# Patient Record
Sex: Female | Born: 1985 | Race: Black or African American | Hispanic: No | Marital: Single | Smoking: Never smoker
Health system: Southern US, Community
[De-identification: ages and names within clinical notes are randomized; demographics above are authoritative.]

## PROBLEM LIST (undated history)

## (undated) ENCOUNTER — Inpatient Hospital Stay (HOSPITAL_COMMUNITY): Payer: Self-pay

## (undated) DIAGNOSIS — Z789 Other specified health status: Secondary | ICD-10-CM

## (undated) DIAGNOSIS — I10 Essential (primary) hypertension: Secondary | ICD-10-CM

## (undated) HISTORY — PX: NO PAST SURGERIES: SHX2092

---
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
## 2009-07-16 ENCOUNTER — Other Ambulatory Visit: Admission: RE | Admit: 2009-07-16 | Discharge: 2009-07-16 | Payer: Self-pay | Admitting: Obstetrics and Gynecology

## 2009-07-16 ENCOUNTER — Ambulatory Visit: Payer: Self-pay | Admitting: Obstetrics and Gynecology

## 2009-07-30 ENCOUNTER — Ambulatory Visit: Payer: Self-pay | Admitting: Obstetrics & Gynecology

## 2010-12-25 ENCOUNTER — Emergency Department (HOSPITAL_COMMUNITY): Payer: No Typology Code available for payment source

## 2010-12-25 ENCOUNTER — Emergency Department (HOSPITAL_COMMUNITY)
Admission: EM | Admit: 2010-12-25 | Discharge: 2010-12-25 | Disposition: A | Discharge status: Home / Self Care | Payer: No Typology Code available for payment source | Attending: Emergency Medicine | Admitting: Emergency Medicine

## 2010-12-25 DIAGNOSIS — S93609A Unspecified sprain of unspecified foot, initial encounter: Secondary | ICD-10-CM | POA: Insufficient documentation

## 2010-12-25 DIAGNOSIS — M79609 Pain in unspecified limb: Secondary | ICD-10-CM | POA: Insufficient documentation

## 2010-12-25 DIAGNOSIS — M25519 Pain in unspecified shoulder: Secondary | ICD-10-CM | POA: Insufficient documentation

## 2010-12-25 DIAGNOSIS — W010XXA Fall on same level from slipping, tripping and stumbling without subsequent striking against object, initial encounter: Secondary | ICD-10-CM | POA: Insufficient documentation

## 2010-12-25 IMAGING — CR DG FOOT COMPLETE 3+V*L*
3 series · 3 of 3 positions shown · non-contrast
Comparison: None.

CLINICAL DATA: Status post fall, twisted left foot, swelling on top
of foot pain

LEFT FOOT - COMPLETE 3+ VIEW

[t foot ap left]
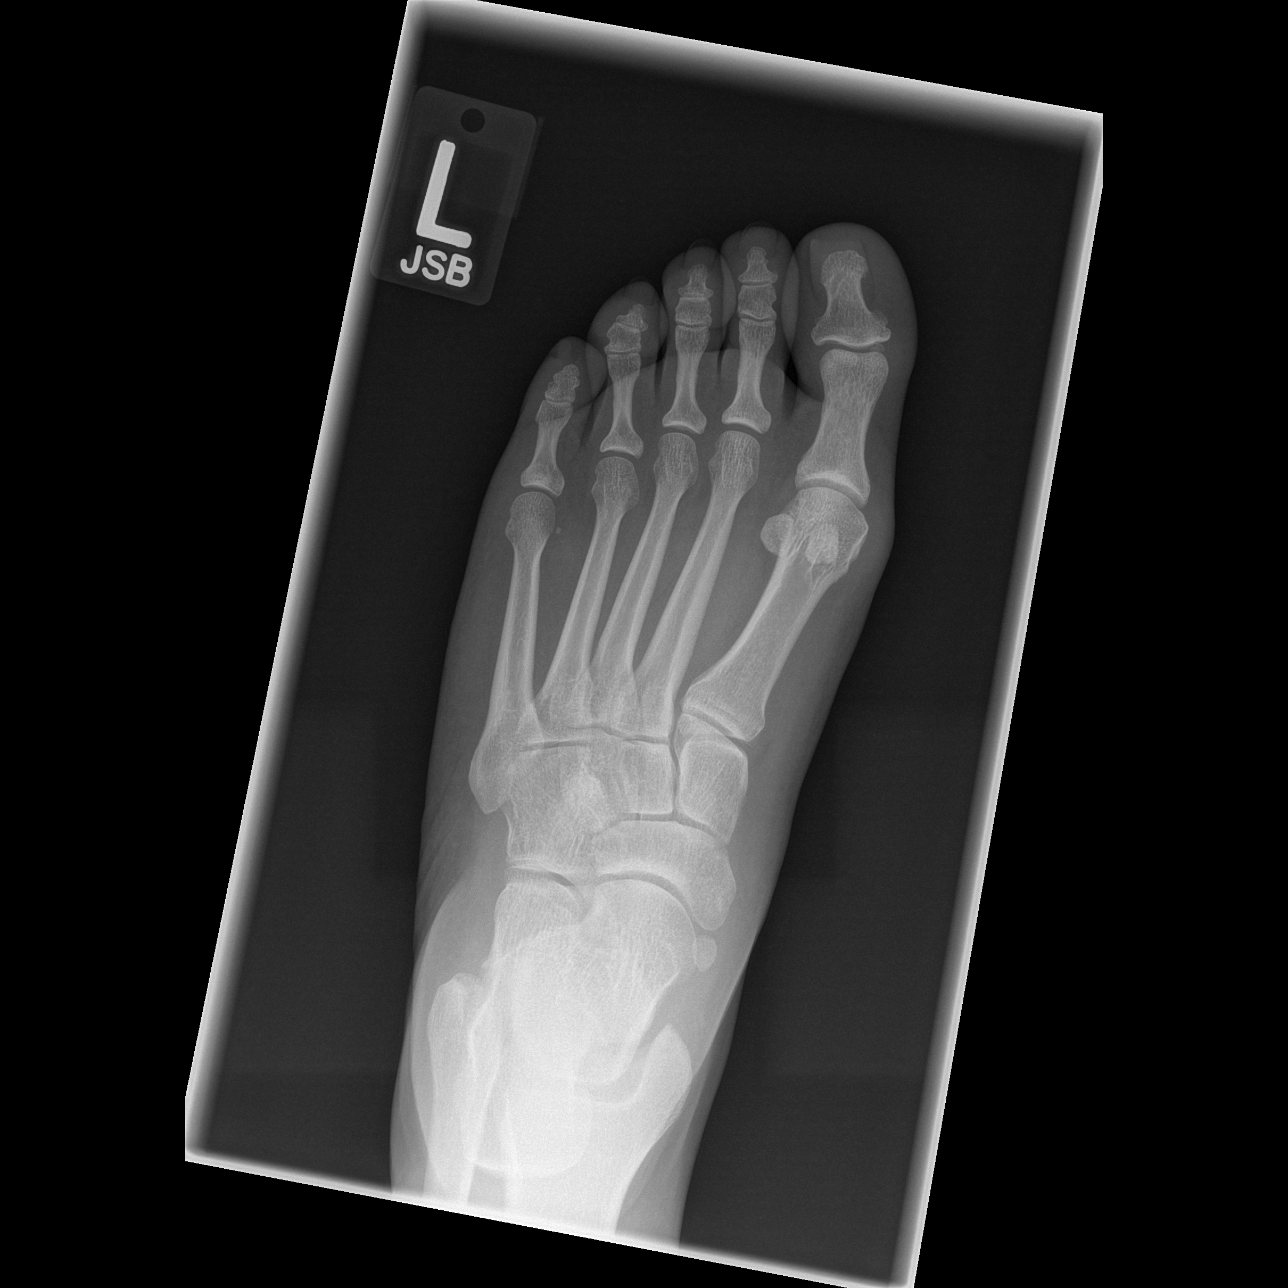

[t foot oblique left]
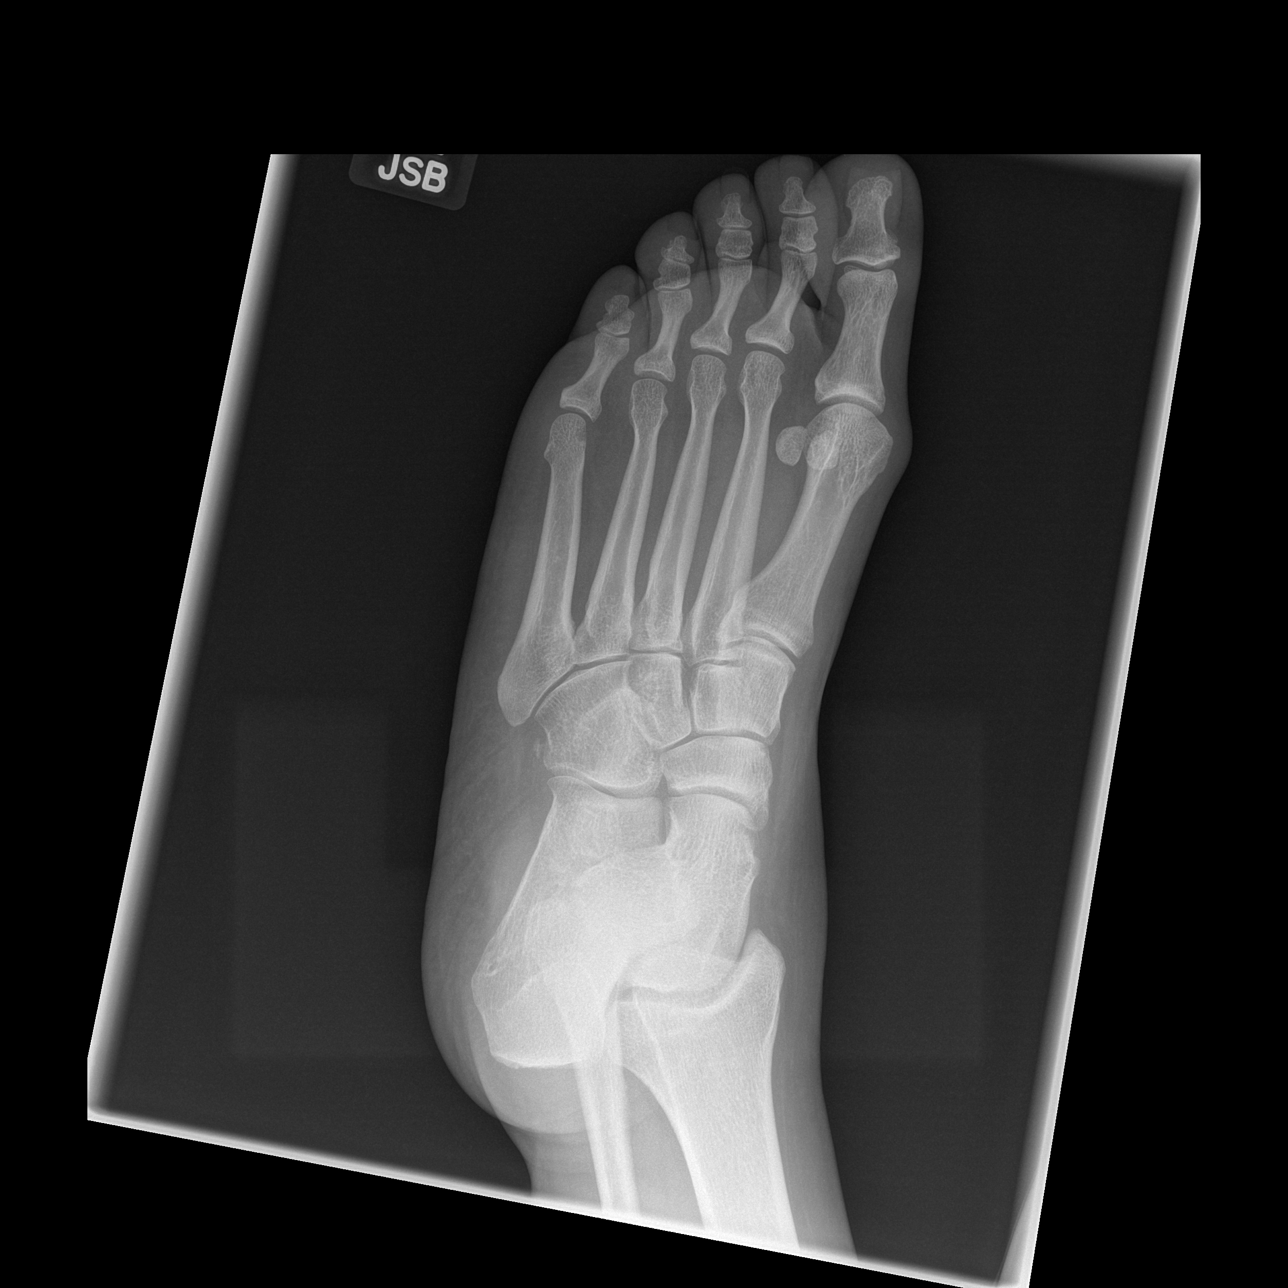

[t foot lat left]
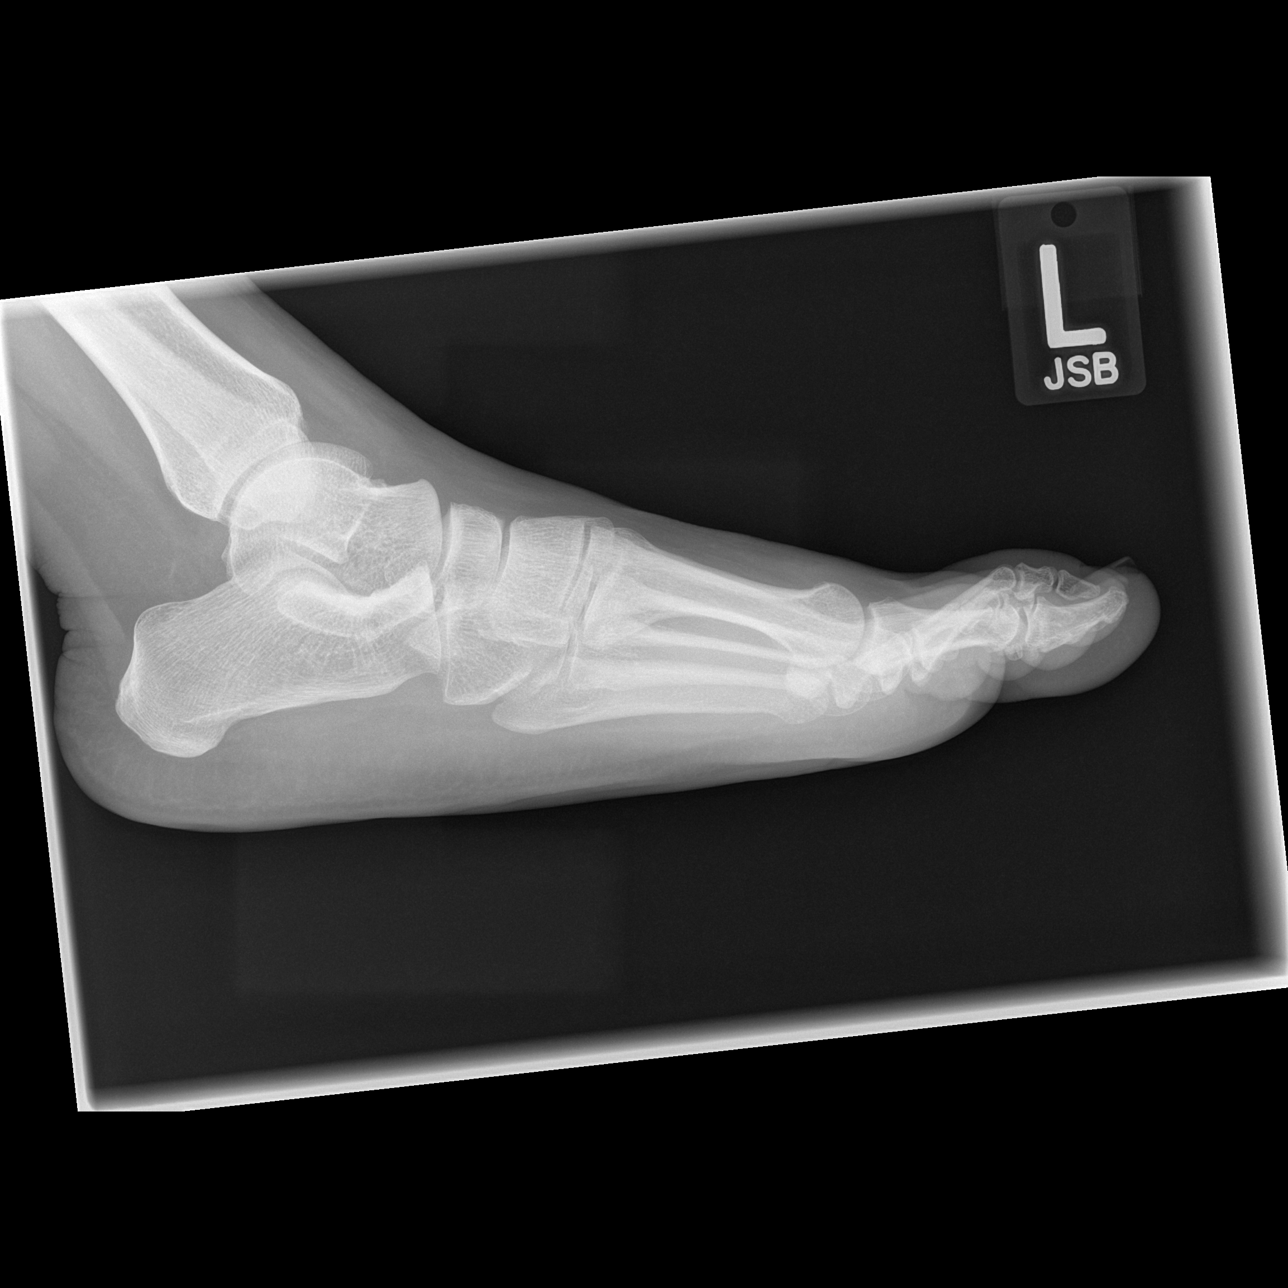

[3 of 3 positions shown; findings below may reference images not displayed]

FINDINGS: No fracture or dislocation is seen.  The base of the
fifth metatarsal is intact.  Well corticated osseous density along
the lateral cuboid, likely reflecting an accessory ossicle.

The joint spaces are preserved.

Visualized soft tissues are grossly unremarkable.
IMPRESSION: No fracture or dislocation is seen.

## 2011-09-07 NOTE — L&D Delivery Note (Signed)
Delivery Note At 3:19 AM a viable and healthy female was delivered via Vaginal, Spontaneous Delivery (Presentation: ; Occiput Anterior).  APGAR: 8, 9; weight 7 lb (3175 g).   Placenta status: Intact, Spontaneous.  Cord: 3 vessels with the following complications: None.  Cord pH: n/a  Anesthesia: Epidural  Episiotomy: None Lacerations: small vaginal tear Suture Repair: n/a, vaginal tear hemostatic Est. Blood Loss (mL): 200  Mom to postpartum.  Baby to nursery-stable.  Camora Tremain 05/26/2012, 5:09 AM

## 2011-09-07 NOTE — L&D Delivery Note (Signed)
Attended delivery. Agree with note. No difficulty with shoulders  Wynelle Bourgeois CNM

## 2011-10-25 LAB — OB RESULTS CONSOLE RPR: RPR: NONREACTIVE

## 2011-10-25 LAB — OB RESULTS CONSOLE HEPATITIS B SURFACE ANTIGEN: Hepatitis B Surface Ag: NEGATIVE

## 2011-10-25 LAB — OB RESULTS CONSOLE ABO/RH: RH Type: POSITIVE

## 2011-10-25 LAB — OB RESULTS CONSOLE ANTIBODY SCREEN: Antibody Screen: NEGATIVE

## 2011-10-25 LAB — OB RESULTS CONSOLE RUBELLA ANTIBODY, IGM: Rubella: IMMUNE

## 2011-11-22 ENCOUNTER — Other Ambulatory Visit: Payer: Self-pay | Admitting: Adult Health

## 2011-11-22 ENCOUNTER — Other Ambulatory Visit (HOSPITAL_COMMUNITY)
Admission: RE | Admit: 2011-11-22 | Discharge: 2011-11-22 | Disposition: A | Discharge status: Home / Self Care | Payer: Medicaid Other | Source: Ambulatory Visit | Attending: Obstetrics and Gynecology | Admitting: Obstetrics and Gynecology

## 2011-11-22 DIAGNOSIS — Z01419 Encounter for gynecological examination (general) (routine) without abnormal findings: Secondary | ICD-10-CM | POA: Insufficient documentation

## 2011-11-22 DIAGNOSIS — Z113 Encounter for screening for infections with a predominantly sexual mode of transmission: Secondary | ICD-10-CM | POA: Insufficient documentation

## 2012-04-25 ENCOUNTER — Inpatient Hospital Stay (HOSPITAL_COMMUNITY)
Admission: AD | Admit: 2012-04-25 | Discharge: 2012-04-25 | Disposition: A | Discharge status: Home / Self Care | Payer: Medicaid Other | Source: Ambulatory Visit | Attending: Obstetrics & Gynecology | Admitting: Obstetrics & Gynecology

## 2012-04-25 ENCOUNTER — Encounter (HOSPITAL_COMMUNITY): Payer: Self-pay | Admitting: Advanced Practice Midwife

## 2012-04-25 DIAGNOSIS — Z331 Pregnant state, incidental: Secondary | ICD-10-CM

## 2012-04-25 DIAGNOSIS — Z3490 Encounter for supervision of normal pregnancy, unspecified, unspecified trimester: Secondary | ICD-10-CM

## 2012-04-25 NOTE — MAU Provider Note (Signed)
  History     CSN: [id number]  Arrival date and time: 04/25/12 1238   None     Chief Complaint  Patient presents with  . Medical Clearance   HPI This is a 26 y.o. 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  Got one from [facility] last semester, but they say they cannot permit her to come to clinical with a lifting restriction (old letter restricted lifting to 25 lb).   She can only miss two days of school before being terminated. Missed today because of this (sent home).  FT office cannot see her today.  OB History    Grav Para Term Preterm Abortions TAB SAB Ect Mult Living   1               No past medical history on file.  No past surgical history on file.  No family history on file.  History  Substance Use Topics  . Smoking status: Not on file  . Smokeless tobacco: Not on file  . Alcohol Use: Not on file    Allergies: Allergies not on file  No prescriptions prior to admission    ROS As listed above. No complaints.  Physical Exam   There were no vitals taken for this visit.  Physical Exam  Constitutional: She is oriented to person, place, and time. She appears well-developed and well-nourished.  Neurological: She is alert and oriented to person, place, and time.  Skin: Skin is warm and dry.  Psychiatric: She has a normal mood and affect.  Exam not indicated.  MAU Course  Procedures  Assessment and Plan  Letter given that she can do clinical with no restrictions.  Wynelle Bourgeois 04/25/2012, 1:31 PM

## 2012-04-25 NOTE — MAU Note (Signed)
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 Patient denies any problems. Reports good fetal movement.

## 2012-04-25 NOTE — MAU Provider Note (Signed)
Attestation of Attending Supervision of Advanced Practitioner (CNM/NP): Evaluation and management procedures were performed by the Advanced Practitioner under my supervision and collaboration.  I have reviewed the Advanced Practitioner's note and chart, and I agree with the management and plan.  Chrystal Zeimet, MD, FACOG Attending Obstetrician & Gynecologist Faculty Practice, [facility], [place]  

## 2012-04-26 ENCOUNTER — Inpatient Hospital Stay (HOSPITAL_COMMUNITY)
Admission: AD | Admit: 2012-04-26 | Discharge: 2012-04-26 | Disposition: A | Discharge status: Home / Self Care | Payer: Medicaid Other | Source: Ambulatory Visit | Attending: Obstetrics & Gynecology | Admitting: Obstetrics & Gynecology

## 2012-04-26 NOTE — MAU Note (Signed)
Patient returned to MAU today stating the note written by the CNM yesterday was not accepted by her instructor. This RN called [facility]. Dr. Emelda Fear will not sign off on her ability to do full clinical duty until he sees her in his office on Monday at 1015. Patient continues to states she is not having any problems.

## 2012-05-15 LAB — OB RESULTS CONSOLE GC/CHLAMYDIA
Chlamydia: NEGATIVE
Gonorrhea: NEGATIVE

## 2012-05-15 LAB — OB RESULTS CONSOLE ABO/RH

## 2012-05-24 ENCOUNTER — Inpatient Hospital Stay (HOSPITAL_COMMUNITY)
Admission: AD | Admit: 2012-05-24 | Discharge: 2012-05-28 | DRG: 775 | Disposition: A | Discharge status: Home / Self Care | Payer: Medicaid Other | Source: Ambulatory Visit | Attending: Obstetrics & Gynecology | Admitting: Obstetrics & Gynecology

## 2012-05-24 ENCOUNTER — Encounter (HOSPITAL_COMMUNITY): Payer: Self-pay | Admitting: *Deleted

## 2012-05-24 ENCOUNTER — Inpatient Hospital Stay (HOSPITAL_COMMUNITY): Payer: Medicaid Other

## 2012-05-24 DIAGNOSIS — O99892 Other specified diseases and conditions complicating childbirth: Secondary | ICD-10-CM | POA: Diagnosis present

## 2012-05-24 DIAGNOSIS — O1490 Unspecified pre-eclampsia, unspecified trimester: Secondary | ICD-10-CM

## 2012-05-24 DIAGNOSIS — O139 Gestational [pregnancy-induced] hypertension without significant proteinuria, unspecified trimester: Principal | ICD-10-CM | POA: Diagnosis present

## 2012-05-24 DIAGNOSIS — Z22330 Carrier of Group B streptococcus: Secondary | ICD-10-CM

## 2012-05-24 HISTORY — DX: Other specified health status: Z78.9

## 2012-05-24 LAB — COMPREHENSIVE METABOLIC PANEL
Alkaline Phosphatase: 103 U/L (ref 39–117)
BUN: 5 mg/dL — ABNORMAL LOW (ref 6–23)
CO2: 21 mEq/L (ref 19–32)
Calcium: 9.1 mg/dL (ref 8.4–10.5)
Chloride: 102 mEq/L (ref 96–112)
Potassium: 4 mEq/L (ref 3.5–5.1)
Total Bilirubin: 0.2 mg/dL — ABNORMAL LOW (ref 0.3–1.2)
Total Protein: 6.8 g/dL (ref 6.0–8.3)

## 2012-05-24 LAB — URINALYSIS, ROUTINE W REFLEX MICROSCOPIC
Bilirubin Urine: NEGATIVE
Glucose, UA: NEGATIVE mg/dL
Ketones, ur: NEGATIVE mg/dL
Leukocytes, UA: NEGATIVE
Nitrite: NEGATIVE
Protein, ur: NEGATIVE mg/dL
pH: 7.5 (ref 5.0–8.0)

## 2012-05-24 LAB — CBC
MCH: 28.4 pg (ref 26.0–34.0)
MCV: 87.1 fL (ref 78.0–100.0)
RDW: 13.7 % (ref 11.5–15.5)

## 2012-05-24 LAB — ABO/RH: ABO/RH(D): O POS

## 2012-05-24 LAB — TYPE AND SCREEN: ABO/RH(D): O POS

## 2012-05-24 MED ORDER — MAGNESIUM SULFATE 40 G IN LACTATED RINGERS - SIMPLE
2.0000 g/h | INTRAVENOUS | Status: DC
  Administered 2012-05-24 – 2012-05-25 (×2): 2 g/h via INTRAVENOUS
  Filled 2012-05-24 (×2): qty 500

## 2012-05-24 MED ORDER — MISOPROSTOL 25 MCG QUARTER TABLET
25.0000 ug | ORAL_TABLET | ORAL | Status: DC | PRN
  Administered 2012-05-24 (×2): 25 ug via VAGINAL
  Filled 2012-05-24 (×2): qty 0.25

## 2012-05-24 MED ORDER — OXYTOCIN BOLUS FROM INFUSION
500.0000 mL | Freq: Once | INTRAVENOUS | Status: DC
  Filled 2012-05-24: qty 500

## 2012-05-24 MED ORDER — TERBUTALINE SULFATE 1 MG/ML IJ SOLN: 0.2500 mg | Freq: Once | INTRAMUSCULAR | Status: AC | PRN

## 2012-05-24 MED ORDER — DEXTROSE 5 % IV SOLN
5.0000 10*6.[IU] | Freq: Once | INTRAVENOUS | Status: DC
  Filled 2012-05-24: qty 5

## 2012-05-24 MED ORDER — CITRIC ACID-SODIUM CITRATE 334-500 MG/5ML PO SOLN: 30.0000 mL | ORAL | Status: DC | PRN

## 2012-05-24 MED ORDER — BUTORPHANOL TARTRATE 1 MG/ML IJ SOLN: 1.0000 mg | INTRAMUSCULAR | Status: DC | PRN

## 2012-05-24 MED ORDER — ONDANSETRON HCL 4 MG/2ML IJ SOLN: 4.0000 mg | Freq: Four times a day (QID) | INTRAMUSCULAR | Status: DC | PRN

## 2012-05-24 MED ORDER — LABETALOL HCL 5 MG/ML IV SOLN
20.0000 mg | INTRAVENOUS | Status: DC | PRN
  Administered 2012-05-25 – 2012-05-26 (×3): 20 mg via INTRAVENOUS
  Filled 2012-05-24 (×4): qty 4

## 2012-05-24 MED ORDER — MAGNESIUM SULFATE BOLUS VIA INFUSION
4.0000 g | Freq: Once | INTRAVENOUS | Status: AC
  Administered 2012-05-24: 4 g via INTRAVENOUS
  Filled 2012-05-24: qty 500

## 2012-05-24 MED ORDER — LACTATED RINGERS IV SOLN
INTRAVENOUS | Status: DC
  Administered 2012-05-24 – 2012-05-25 (×2): via INTRAVENOUS
  Administered 2012-05-25: 100 mL/h via INTRAVENOUS
  Administered 2012-05-26: 05:00:00 via INTRAVENOUS

## 2012-05-24 MED ORDER — IBUPROFEN 600 MG PO TABS: 600.0000 mg | ORAL_TABLET | Freq: Four times a day (QID) | ORAL | Status: DC | PRN

## 2012-05-24 MED ORDER — LACTATED RINGERS IV SOLN: 500.0000 mL | INTRAVENOUS | Status: DC | PRN

## 2012-05-24 MED ORDER — ACETAMINOPHEN 325 MG PO TABS
650.0000 mg | ORAL_TABLET | ORAL | Status: DC | PRN
  Administered 2012-05-25 (×2): 650 mg via ORAL
  Filled 2012-05-24 (×2): qty 2

## 2012-05-24 MED ORDER — LIDOCAINE HCL (PF) 1 % IJ SOLN
30.0000 mL | INTRAMUSCULAR | Status: DC | PRN
  Filled 2012-05-24: qty 30

## 2012-05-24 MED ORDER — OXYTOCIN 40 UNITS IN LACTATED RINGERS INFUSION - SIMPLE MED
62.5000 mL/h | Freq: Once | INTRAVENOUS | Status: AC
  Administered 2012-05-26: 500 mL/h via INTRAVENOUS

## 2012-05-24 MED ORDER — PENICILLIN G POTASSIUM 5000000 UNITS IJ SOLR
2.5000 10*6.[IU] | INTRAVENOUS | Status: DC
  Filled 2012-05-24: qty 2.5

## 2012-05-24 MED ORDER — FLEET ENEMA 7-19 GM/118ML RE ENEM: 1.0000 | ENEMA | RECTAL | Status: DC | PRN

## 2012-05-24 MED ORDER — OXYCODONE-ACETAMINOPHEN 5-325 MG PO TABS: 1.0000 | ORAL_TABLET | ORAL | Status: DC | PRN

## 2012-05-24 NOTE — Progress Notes (Signed)
   Cristina Murphy is a 26 y.o. G1P0 at [redacted]w[redacted]d  admitted for induction of labor due to Hypertension.  Subjective: Denies HA, blurred vision, RUQ pain.  Feels "crampy"  Objective: BP 148/70  Pulse 91  Temp 98.3 F (36.8 C) (Oral)  Resp 20  Ht 5\' 4"  (1.626 m)  Wt 131.09 kg (289 lb)  BMI 49.61 kg/m2  LMP 08/30/2011 Total I/O In: 920 [P.O.:420; I.V.:500] Out: 575 [Urine:575]  FHT:  FHR: 140 bpm, variability: moderate,  accelerations:  Present,  decelerations:  Absent UC:   none, irregular, every 2-6 minutes SVE:   Dilation: 1 Effacement (%): Thick Station: -3 Exam by:: Everrett Coombe RN  Labs: Lab Results  Component Value Date   WBC 14.3* 05/24/2012   HGB 11.2* 05/24/2012   HCT 34.3* 05/24/2012   MCV 87.1 05/24/2012   PLT 214 05/24/2012    Assessment / Plan: IOL, ripening phase  Labor: no Fetal Wellbeing:  Category I Pain Control:  Labor support without medications Anticipated MOD:  NSVD  Murphy,Cristina Haslem 05/24/2012, 11:07 PM

## 2012-05-24 NOTE — Progress Notes (Signed)
Patient asking if she could have the option of trying BP medication and going home so she could make it to class tomorrow. States she has a test and cannot miss anymore or she will be failed in the class. Advised patient to discuss with Dr. Despina Hidden as he is her primary MD at [facility] and is on call tonight.

## 2012-05-24 NOTE — Progress Notes (Signed)
1745 Pt arrived to unit requesting to speak with attending physician about plan of care.  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  Dr Despina Hidden will come to bedside.

## 2012-05-24 NOTE — Progress Notes (Signed)
Cristina Murphy is a 26 y.o. G1P0 at 110w2d by LMP admitted for induction of labor due to Hypertension.  Subjective: Pt feeling well, sitting in bed eating dinner. NO complaints at this time.  Appears to be more content now, understands that it is important that she be induced.  Wants to make sure the baby isn't stressed from the induction.  Objective: BP 148/78  Pulse 94  Temp 97.8 F (36.6 C) (Oral)  Resp 18  Ht 5\' 4"  (1.626 m)  Wt 131.09 kg (289 lb)  BMI 49.61 kg/m2  LMP 08/30/2011 I/O last 3 completed shifts: In: 152.1 [I.V.:152.1] Out: 0     FHT:  FHR: 130 bpm, variability: moderate,  accelerations:  Present,  decelerations:  Absent UC:   irregular, every 2-6 minutes SVE:   Dilation: Closed Effacement (%): Thick Station: -3 Exam by:: Currie Paris, RN  Labs: Lab Results  Component Value Date   WBC 14.3* 05/24/2012   HGB 11.2* 05/24/2012   HCT 34.3* 05/24/2012   MCV 87.1 05/24/2012   PLT 214 05/24/2012    Assessment / Plan: IOL for severe range BPs, GHTN, s/p cytotec x1  Labor: s/p cytotec x1 Preeclampsia:  on magnesium sulfate, no signs or symptoms of toxicity and intake and ouput balanced Fetal Wellbeing:  Category I Pain Control:  Labor support without medications and epidural once in more active labor if pt desires I/D:  n/a; will start PCN around noon tomorrow for GBS ppx, once in more active labor Anticipated MOD:  NSVD  Johnwilliam Shepperson 05/24/2012, 8:10 PM

## 2012-05-24 NOTE — MAU Provider Note (Signed)
History     CSN: [id number]  Arrival date and time: 05/24/12 1434   None     Chief Complaint  Patient presents with  . 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  Pt had PNV 2 days ago at [facility] and systolic BP was 150's- labs were drawn and urine was sent at that visit.  Pt was instructed to f/u tomorrow. Patient reports no symptoms until this morning when driving to work.  She suddenly felt light-headed and had some hazy vision. She thought it was from not eating, but experienced this again after she ate.  She denies headache, spots in her vision, RUQ pain, nausea.  No h/o HTN outside of pregnancy. No problems with BP until this most recent visit.     No ctx, no LOF, no vaginal bleeding. +FM  Hypertension Associated symptoms include blurred vision. Pertinent negatives include no chest pain, headaches or shortness of breath.    OB History    Grav Para Term Preterm Abortions TAB SAB Ect Mult Living   1         0      Past Medical History  Diagnosis Date  . No pertinent past medical history     Past Surgical History  Procedure Date  . No past surgeries     History reviewed. No pertinent family history.  History  Substance Use Topics  . Smoking status: Never Smoker   . Smokeless tobacco: Never Used  . Alcohol Use: No    Allergies: No Known Allergies  Prescriptions prior to admission  Medication Sig Dispense Refill  . omeprazole (PRILOSEC) 20 MG capsule Take 20 mg by mouth as needed.      . Prenatal Vit-Fe Fumarate-FA (MULTIVITAMIN-PRENATAL) 27-0.8 MG TABS Take 1 tablet by mouth daily.        Review of Systems  Constitutional: Negative for fever.  Eyes: Positive for blurred vision.  Respiratory: Negative for cough and shortness of breath.   Cardiovascular: Negative for chest pain.    Gastrointestinal: Negative for nausea and abdominal pain.  Genitourinary: Negative for dysuria.  Musculoskeletal: Negative for falls.  Neurological: Positive for dizziness. Negative for focal weakness, seizures and headaches.   Physical Exam   Blood pressure 161/98, pulse 81, temperature 98.6 F (37 C), temperature source Oral, resp. rate 18, height 5\' 4"  (1.626 m), weight 131.09 kg (289 lb), last menstrual period 08/30/2011.  Physical Exam  Constitutional: She appears well-developed and well-nourished. No distress.  HENT:  Head: Normocephalic and atraumatic.  Cardiovascular: Normal rate, regular rhythm, normal heart sounds and intact distal pulses.   No murmur heard. Respiratory: Effort normal and breath sounds normal. No respiratory distress. She has no wheezes.  GI:       gravid  Musculoskeletal: She exhibits edema.       1+ BLE edema   Neurological: She displays normal reflexes.  Skin: Skin is warm and dry.   FHT: 120's, moderate variability, + accels Toco: no ctx  Dilation: Closed Effacement (%): Thick Cervical Position: Posterior Station: -3 Presentation: Vertex Exam by:: Dr. Fara Boros   MAU Course  Procedures  MDM -Will check PIH labs to r/o pre-eclampsia vs gestational HTN (UA and CBC normal; CMET and Pr:Cr pending) -Will check cervix as pt likely needs to be admitted to be induced regardless  of if this is GHTN vs preX 2/2 severe range pressures -Called [facility], who will fax most recent U/S and records -Will check AFI if no U/S in the last 1 week -Treat pressures with labetolol 20mg  IV PRN  All labs (plt, LFTs, Pr:Cr ratio normal) Pr:Cr 0.11  Assessment and Plan  26 yo G1P0 @ 38.2 (LMP and early U/S) p/w GHTN with severe range pressures -Admit to L&D for IOL for severe range pressures and GHTN -Mag 2/2 severe range pressures -Labetalol PRN BP >160/110 -Cytotec for induction for unfavorable cervix -Will let pt eat dinner -GBS positive: will start on  PCN in AM for ppx  MCGILL,JACQUELYN 05/24/2012, 3:32 PM  Evaluation and management procedures were performed by Resident physician under my supervision/collaboration. Chart reviewed, patient examined by me and I agree with management and plan. (except did not agree with [place] which was done before I was consulted)

## 2012-05-24 NOTE — MAU Note (Signed)
States was feeling lightheaded at work. Had someone check BP and it was elevated.

## 2012-05-24 NOTE — H&P (Signed)
Cristina Murphy is a 26 y.o. female presenting for lightheadedness and hazy vision.  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 Pt was instructed to f/u tomorrow. Patient reports no symptoms until this morning when driving to work. She suddenly felt light-headed and had some hazy vision. She thought it was from not eating, but experienced this again after she ate. She denies headache, spots in her vision, RUQ pain, nausea. No h/o HTN outside of pregnancy. No problems with BP until this most recent visit.  No ctx, no LOF, no vaginal bleeding. +FM . History OB History    Grav Para Term Preterm Abortions TAB SAB Ect Mult Living   1         0     Past Medical History  Diagnosis Date  . No pertinent past medical history    Past Surgical History  Procedure Date  . No past surgeries    Family History: family history is not on file. Social History:  reports that she has never smoked. She has never used smokeless tobacco. She reports that she does not drink alcohol or use illicit drugs.   Prenatal Transfer Tool  Maternal Diabetes: No Genetic Screening: Normal Maternal Ultrasounds/Referrals: Normal Fetal Ultrasounds or other Referrals:  None Maternal Substance Abuse:  No Significant Maternal Medications:  None Significant Maternal Lab Results:  Lab values include: Group B Strep positive Other Comments:  IOL at 38.2 for GHTN, severe range pressures  ROS  Dilation: Closed Effacement (%): Thick Station: -3 Exam by:: Dr. Fara Boros Blood pressure 178/101, pulse 90, temperature 98.6 F (37 C), temperature source Oral, resp. rate 18, height 5\' 4"  (1.626 m), weight 131.09 kg (289 lb), last menstrual period 08/30/2011. Exam Physical Exam  Constitutional:  She appears well-developed and well-nourished.  HENT:  Head: Normocephalic and atraumatic.  Eyes: Pupils are equal, round, and reactive to light.  Cardiovascular: Normal rate, regular rhythm, normal heart sounds and intact distal pulses.   No murmur heard. Respiratory: Effort normal and breath sounds normal. No respiratory distress. She has no wheezes.  GI:       Gravid, obese   Musculoskeletal: She exhibits edema.       1+ BLE pitting edema  Neurological: She is alert. She displays normal reflexes.  Skin: Skin is warm and dry.    FHT: 120's, moderate variability, + accels Toco: no ctx  Dilation: Closed Effacement (%): Thick Cervical Position: Posterior Station: -3 Presentation: Vertex Exam by:: Dr. Fara Boros   Prenatal labs: ABO, Rh: O/--/-- (09/09 0000) Antibody: Negative (02/18 0000) Rubella: Immune (02/18 0000) RPR: Nonreactive (02/18 0000)  HBsAg: Negative (02/18 0000)  HIV: Non-reactive (02/18 0000)  GBS: Positive (09/09 0000)   Assessment/Plan: 26 yo G1P0 @ 38.2 (LMP and early U/S) p/w GHTN with severe range pressures  -Admit to L&D for IOL for severe range pressures and GHTN  -Mag 2/2 severe range pressures  -Labetalol PRN BP >160/110  -Cytotec for induction for unfavorable cervix  -Will let pt eat dinner  -GBS positive: will start on PCN in AM for ppx -Category 1 fetal heart tracing    Cristina Murphy 05/24/2012, 5:26 PM

## 2012-05-25 ENCOUNTER — Encounter (HOSPITAL_COMMUNITY): Payer: Self-pay | Admitting: *Deleted

## 2012-05-25 MED ORDER — EPHEDRINE 5 MG/ML INJ: 10.0000 mg | INTRAVENOUS | Status: DC | PRN

## 2012-05-25 MED ORDER — PHENYLEPHRINE 40 MCG/ML (10ML) SYRINGE FOR IV PUSH (FOR BLOOD PRESSURE SUPPORT)
80.0000 ug | PREFILLED_SYRINGE | INTRAVENOUS | Status: DC | PRN
  Filled 2012-05-25: qty 5

## 2012-05-25 MED ORDER — LACTATED RINGERS IV SOLN
500.0000 mL | Freq: Once | INTRAVENOUS | Status: AC
  Administered 2012-05-26: 500 mL via INTRAVENOUS

## 2012-05-25 MED ORDER — ZOLPIDEM TARTRATE 5 MG PO TABS: 5.0000 mg | ORAL_TABLET | Freq: Every evening | ORAL | Status: DC | PRN

## 2012-05-25 MED ORDER — EPHEDRINE 5 MG/ML INJ
10.0000 mg | INTRAVENOUS | Status: DC | PRN
  Filled 2012-05-25: qty 4

## 2012-05-25 MED ORDER — OXYTOCIN 40 UNITS IN LACTATED RINGERS INFUSION - SIMPLE MED
1.0000 m[IU]/min | INTRAVENOUS | Status: DC
  Administered 2012-05-25: 2 m[IU]/min via INTRAVENOUS
  Filled 2012-05-25: qty 1000

## 2012-05-25 MED ORDER — PHENYLEPHRINE 40 MCG/ML (10ML) SYRINGE FOR IV PUSH (FOR BLOOD PRESSURE SUPPORT): 80.0000 ug | PREFILLED_SYRINGE | INTRAVENOUS | Status: DC | PRN

## 2012-05-25 MED ORDER — FENTANYL 2.5 MCG/ML BUPIVACAINE 1/10 % EPIDURAL INFUSION (WH - ANES)
14.0000 mL/h | INTRAMUSCULAR | Status: DC
  Administered 2012-05-26: 14 mL/h via EPIDURAL
  Filled 2012-05-25: qty 60

## 2012-05-25 MED ORDER — DIPHENHYDRAMINE HCL 50 MG/ML IJ SOLN: 12.5000 mg | INTRAMUSCULAR | Status: DC | PRN

## 2012-05-25 MED ORDER — LABETALOL HCL 5 MG/ML IV SOLN
20.0000 mg | Freq: Once | INTRAVENOUS | Status: AC
  Administered 2012-05-25: 20 mg via INTRAVENOUS

## 2012-05-25 MED ORDER — FENTANYL CITRATE 0.05 MG/ML IJ SOLN
50.0000 ug | INTRAMUSCULAR | Status: DC | PRN
  Administered 2012-05-25 (×3): 50 ug via INTRAVENOUS
  Filled 2012-05-25 (×3): qty 2

## 2012-05-25 MED ORDER — PENICILLIN G POTASSIUM 5000000 UNITS IJ SOLR
2.5000 10*6.[IU] | INTRAVENOUS | Status: DC
  Administered 2012-05-25 – 2012-05-26 (×5): 2.5 10*6.[IU] via INTRAVENOUS
  Filled 2012-05-25 (×9): qty 2.5

## 2012-05-25 MED ORDER — TERBUTALINE SULFATE 1 MG/ML IJ SOLN: 0.2500 mg | Freq: Once | INTRAMUSCULAR | Status: AC | PRN

## 2012-05-25 MED ORDER — PENICILLIN G POTASSIUM 5000000 UNITS IJ SOLR
5.0000 10*6.[IU] | Freq: Once | INTRAVENOUS | Status: AC
  Administered 2012-05-25: 5 10*6.[IU] via INTRAVENOUS
  Filled 2012-05-25: qty 5

## 2012-05-25 NOTE — Progress Notes (Signed)
Cristina Murphy is a 26 y.o. G1P0 at [redacted]w[redacted]d admitted for induction of labor due to severe range blood pressures.  Subjective:  Pt doing well. Contracting intermittently but not feeling very many of them. Foley bulb still in place.  Denies HA, vision changes, abdominal pain or other concerns.    Objective: BP 159/92  Pulse 84  Temp 97.9 F (36.6 C) (Oral)  Resp 18  Ht 5\' 4"  (1.626 m)  Wt 131.09 kg (289 lb)  BMI 49.61 kg/m2  LMP 08/30/2011 I/O last 3 completed shifts: In: 2742.1 [P.O.:840; I.V.:1652.1; IV Piggyback:250] Out: 1850 [Urine:1850] Total I/O In: 196.3 [P.O.:90; I.V.:106.3] Out: -   FHT:  FHR: 130 bpm, variability: moderate,  accelerations:  Present,  decelerations:  Absent. Last 25 minutes seems to be in a sleep cycle but prior to this, great reactivity UC:   irregular, every 4-12 minutes SVE:   Dilation: 1.5 Effacement (%): 50 Station: -3 Exam by:: Claiborne Rigg CNM  Labs: Lab Results  Component Value Date   WBC 14.3* 05/24/2012   HGB 11.2* 05/24/2012   HCT 34.3* 05/24/2012   MCV 87.1 05/24/2012   PLT 214 05/24/2012    Assessment / Plan: Induction of labor due to severe range blood pressures,  progressing well on pitocin  Labor: induction. Foley bulb in place. will start pitocin @10am  Preeclampsia:  on magnesium sulfate, no signs or symptoms of toxicity, intake and ouput balanced and labs stable Fetal Wellbeing:  Category I and with periods of minimal variability leading to category II tracing- now category I Pain Control:  Labor support without medications I/D:  n/a Anticipated MOD:  NSVD  Rulon Abide 05/25/2012, 9:07 AM

## 2012-05-25 NOTE — Progress Notes (Signed)
Dr. Reola Calkins notified of pt's elevated bp and orders to recheck bp at 2000

## 2012-05-25 NOTE — Progress Notes (Signed)
   Cristina Murphy is a 26 y.o. G1P0 at [redacted]w[redacted]d  admitted for induction of labor due to Hypertension.  Subjective: Feeling crampy   Objective: BP 128/84  Pulse 101  Temp 97.2 F (36.2 C) (Oral)  Resp 18  Ht 5\' 4"  (1.626 m)  Wt 131.09 kg (289 lb)  BMI 49.61 kg/m2  LMP 08/30/2011 Total I/O In: 1595 [P.O.:720; I.V.:875] Out: 850 [Urine:850]  FHT:  FHR: 140 bpm, variability: moderate,  accelerations:  Present,  decelerations:  Absent;  Had a few mild variables and several late decels earlier which responded well to position changes UC:   irregular, every 2-5  minutes SVE:   1-2/50/-3/soft Foley inserted into cervix and inflated with 60cc H20 Labs: Lab Results  Component Value Date   WBC 14.3* 05/24/2012   HGB 11.2* 05/24/2012   HCT 34.3* 05/24/2012   MCV 87.1 05/24/2012   PLT 214 05/24/2012    Assessment / Plan: IOL for severe GHTN, ripening phase  Labor: no Fetal Wellbeing:  Category I Pain Control:  Labor support without medications Anticipated MOD:  NSVD  [name],Modine Oppenheimer 05/25/2012, 3:24 AM

## 2012-05-25 NOTE — Progress Notes (Signed)
Cristina Murphy is a 26 y.o. G1P0 at [redacted]w[redacted]d  admitted for induction of labor due to Hypertension.  Subjective: Pt has a little bit of a frontal HA.  No vision changes, abdominal pain.  +CTX every 3-4 minutes.    Objective: BP 152/78  Pulse 83  Temp 97.9 F (36.6 C) (Oral)  Resp 20  Ht 5\' 4"  (1.626 m)  Wt 131.09 kg (289 lb)  BMI 49.61 kg/m2  LMP 08/30/2011 I/O last 3 completed shifts: In: 2742.1 [P.O.:840; I.V.:1652.1; IV Piggyback:250] Out: 1850 [Urine:1850] Total I/O In: 1539.1 [P.O.:570; I.V.:769.1; IV Piggyback:200] Out: 1450 [Urine:1450]  FHT:  FHR: 130 bpm, variability: moderate,  accelerations:  Present,  decelerations:  Absent UC:   regular, every 3-4 minutes for the last 1 hour SVE:   Dilation: 2.5 Effacement (%): 60 Station: -3 Exam by:: dr. Reola Calkins  Labs: Lab Results  Component Value Date   WBC 14.3* 05/24/2012   HGB 11.2* 05/24/2012   HCT 34.3* 05/24/2012   MCV 87.1 05/24/2012   PLT 214 05/24/2012    Assessment / Plan: Induction of labor due to gestational hypertension,  progressing well on pitocin  Labor: Progressing normally Preeclampsia:  on magnesium sulfate, no signs or symptoms of toxicity, intake and ouput balanced and labs stable Fetal Wellbeing:  Category I Pain Control:  Labor support without medications I/D:  n/a Anticipated MOD:  NSVD  Rulon Abide 05/25/2012, 2:29 PM

## 2012-05-25 NOTE — Progress Notes (Signed)
Cristina Murphy is a 26 y.o. G1P0 at 102w3d by ultrasound admitted for induction of labor due to Hypertension.  Subjective:   Objective: BP 142/79  Pulse 83  Temp 98.9 F (37.2 C) (Oral)  Resp 18  Ht 5\' 4"  (1.626 m)  Wt 289 lb (131.09 kg)  BMI 49.61 kg/m2  LMP 08/30/2011 I/O last 3 completed shifts: In: 5755.8 [P.O.:2010; I.V.:3195.8; IV Piggyback:550] Out: 4700 [Urine:4700] Total I/O In: 350 [I.V.:250; IV Piggyback:100] Out: 475 [Urine:475]  Filed Vitals:   05/25/12 1932 05/25/12 2000 05/25/12 2032 05/25/12 2118  BP: 166/102 167/91 135/67 142/79  Pulse: 82 87 91 83  Temp:  98.9 F (37.2 C)    TempSrc:  Oral    Resp:  18    Height:      Weight:        FHT:  FHR: 140 bpm, variability: moderate,  accelerations:  Abscent,  decelerations:  Absent UC:   irregular, every 3 minutes SVE:  ? Dilation/ 50% / Floating.  Cervix very soft and there is bloody show, but cervix is unreachable.  No presenting part in pelvis.  Bedside US done which shows head in R oblique, near pelvis but not engaged  Labs: Lab Results  Component Value Date   WBC 14.3* 05/24/2012   HGB 11.2* 05/24/2012   HCT 34.3* 05/24/2012   MCV 87.1 05/24/2012   PLT 214 05/24/2012    Assessment / Plan: Induction of labor for Preeclampsia  Labor: progressing slowly with nonengaged head Preeclampsia:  on magnesium sulfate, no signs or symptoms of toxicity, intake and ouput balanced and labs stable Fetal Wellbeing:  Category II Pain Control:  Labor support without medications I/D:  n/a Anticipated MOD:  NSVD but fetal head nonengagement is concerning  [facility] 05/25/2012, 9:44 PM

## 2012-05-25 NOTE — Progress Notes (Signed)
Cristina Murphy is a 26 y.o. G1P0 at [redacted]w[redacted]d admitted for induction of labor due to Hypertension.  Subjective:  Pt feeling contractions but they are mild. Just woke up from a nap. They seem to have spaced out more recently.  She does have a headache related to the magnesium.   Objective: BP 168/87  Pulse 88  Temp 98 F (36.7 C) (Axillary)  Resp 18  Ht 5\' 4"  (1.626 m)  Wt 131.09 kg (289 lb)  BMI 49.61 kg/m2  LMP 08/30/2011 I/O last 3 completed shifts: In: 2742.1 [P.O.:840; I.V.:1652.1; IV Piggyback:250] Out: 1850 [Urine:1850] Total I/O In: 1539.1 [P.O.:570; I.V.:769.1; IV Piggyback:200] Out: 1450 [Urine:1450]  FHT:  FHR: 130 bpm, variability: moderate,  accelerations:  Present,  decelerations:  Absent UC:   irregular, every 2-5 minutes SVE:   Dilation: 2.5 Effacement (%): 60 Station: -3 Exam by:: dr. Reola Calkins  Labs: Lab Results  Component Value Date   WBC 14.3* 05/24/2012   HGB 11.2* 05/24/2012   HCT 34.3* 05/24/2012   MCV 87.1 05/24/2012   PLT 214 05/24/2012    Assessment / Plan: Induction of labor due to gestational hypertension,  progressing well on pitocin  Labor: currently not in a good contraction pattern. In latent labor. PT requested waiting to recheck cervix. Increased pit to 52mu/min Preeclampsia:  on magnesium sulfate, no signs or symptoms of toxicity, intake and ouput balanced and labs stable Fetal Wellbeing:  Category I Pain Control:  Labor support without medications I/D:  n/a Anticipated MOD:  NSVD  Rulon Abide 05/25/2012, 5:53 PM

## 2012-05-26 ENCOUNTER — Encounter (HOSPITAL_COMMUNITY): Payer: Self-pay | Admitting: Anesthesiology

## 2012-05-26 ENCOUNTER — Inpatient Hospital Stay (HOSPITAL_COMMUNITY): Payer: Medicaid Other | Admitting: Anesthesiology

## 2012-05-26 ENCOUNTER — Encounter (HOSPITAL_COMMUNITY): Payer: Self-pay | Admitting: *Deleted

## 2012-05-26 DIAGNOSIS — O9989 Other specified diseases and conditions complicating pregnancy, childbirth and the puerperium: Secondary | ICD-10-CM

## 2012-05-26 DIAGNOSIS — O1490 Unspecified pre-eclampsia, unspecified trimester: Secondary | ICD-10-CM | POA: Diagnosis present

## 2012-05-26 DIAGNOSIS — O139 Gestational [pregnancy-induced] hypertension without significant proteinuria, unspecified trimester: Secondary | ICD-10-CM

## 2012-05-26 DIAGNOSIS — Z22330 Carrier of Group B streptococcus: Secondary | ICD-10-CM

## 2012-05-26 LAB — COMPREHENSIVE METABOLIC PANEL
ALT: 15 U/L (ref 0–35)
AST: 28 U/L (ref 0–37)
Albumin: 2.4 g/dL — ABNORMAL LOW (ref 3.5–5.2)
Calcium: 8.7 mg/dL (ref 8.4–10.5)
Creatinine, Ser: 0.68 mg/dL (ref 0.50–1.10)
GFR calc non Af Amer: >90 mL/min (ref >90)
Sodium: 133 mEq/L — ABNORMAL LOW (ref 135–145)
Total Protein: 6.1 g/dL (ref 6.0–8.3)

## 2012-05-26 LAB — CBC
HCT: 35.1 % — ABNORMAL LOW (ref 36.0–46.0)
HCT: 36.7 % (ref 36.0–46.0)
Hemoglobin: 11.8 g/dL — ABNORMAL LOW (ref 12.0–15.0)
MCH: 28.2 pg (ref 26.0–34.0)
MCHC: 32.5 g/dL (ref 30.0–36.0)
MCV: 87.2 fL (ref 78.0–100.0)
RDW: 13.8 % (ref 11.5–15.5)
WBC: 19.8 10*3/uL — ABNORMAL HIGH (ref 4.0–10.5)

## 2012-05-26 MED ORDER — TETANUS-DIPHTH-ACELL PERTUSSIS 5-2.5-18.5 LF-MCG/0.5 IM SUSP
0.5000 mL | Freq: Once | INTRAMUSCULAR | Status: DC
  Filled 2012-05-26: qty 0.5

## 2012-05-26 MED ORDER — DIBUCAINE 1 % RE OINT: 1.0000 "application " | TOPICAL_OINTMENT | RECTAL | Status: DC | PRN

## 2012-05-26 MED ORDER — DIPHENHYDRAMINE HCL 25 MG PO CAPS: 25.0000 mg | ORAL_CAPSULE | Freq: Four times a day (QID) | ORAL | Status: DC | PRN

## 2012-05-26 MED ORDER — INFLUENZA VIRUS VACC SPLIT PF IM SUSP
0.5000 mL | INTRAMUSCULAR | Status: AC
Start: 2012-05-27 — End: 2012-05-27
  Administered 2012-05-27: 0.5 mL via INTRAMUSCULAR
  Filled 2012-05-26: qty 0.5

## 2012-05-26 MED ORDER — OXYCODONE-ACETAMINOPHEN 5-325 MG PO TABS
1.0000 | ORAL_TABLET | ORAL | Status: DC | PRN
  Administered 2012-05-27: 1 via ORAL
  Filled 2012-05-26: qty 1

## 2012-05-26 MED ORDER — IBUPROFEN 600 MG PO TABS
600.0000 mg | ORAL_TABLET | Freq: Four times a day (QID) | ORAL | Status: DC
  Administered 2012-05-26 – 2012-05-28 (×10): 600 mg via ORAL
  Filled 2012-05-26 (×10): qty 1

## 2012-05-26 MED ORDER — PRENATAL MULTIVITAMIN CH
1.0000 | ORAL_TABLET | Freq: Every day | ORAL | Status: DC
  Administered 2012-05-26 – 2012-05-28 (×3): 1 via ORAL
  Filled 2012-05-26 (×3): qty 1

## 2012-05-26 MED ORDER — LIDOCAINE HCL (PF) 1 % IJ SOLN
INTRAMUSCULAR | Status: DC | PRN
  Administered 2012-05-26 (×2): 5 mL

## 2012-05-26 MED ORDER — ZOLPIDEM TARTRATE 5 MG PO TABS: 5.0000 mg | ORAL_TABLET | Freq: Every evening | ORAL | Status: DC | PRN

## 2012-05-26 MED ORDER — MAGNESIUM SULFATE 40 G IN LACTATED RINGERS - SIMPLE
2.0000 g/h | INTRAVENOUS | Status: DC
  Administered 2012-05-26: 2 g/h via INTRAVENOUS
  Filled 2012-05-26 (×2): qty 500

## 2012-05-26 MED ORDER — SIMETHICONE 80 MG PO CHEW: 80.0000 mg | CHEWABLE_TABLET | ORAL | Status: DC | PRN

## 2012-05-26 MED ORDER — LACTATED RINGERS IV SOLN
INTRAVENOUS | Status: DC
  Administered 2012-05-26 – 2012-05-27 (×2): via INTRAVENOUS

## 2012-05-26 MED ORDER — ONDANSETRON HCL 4 MG/2ML IJ SOLN: 4.0000 mg | INTRAMUSCULAR | Status: DC | PRN

## 2012-05-26 MED ORDER — BENZOCAINE-MENTHOL 20-0.5 % EX AERO
1.0000 "application " | INHALATION_SPRAY | CUTANEOUS | Status: DC | PRN
  Filled 2012-05-26: qty 56

## 2012-05-26 MED ORDER — SENNOSIDES-DOCUSATE SODIUM 8.6-50 MG PO TABS
2.0000 | ORAL_TABLET | Freq: Every day | ORAL | Status: DC
  Administered 2012-05-26 – 2012-05-27 (×2): 2 via ORAL

## 2012-05-26 MED ORDER — WITCH HAZEL-GLYCERIN EX PADS: 1.0000 "application " | MEDICATED_PAD | CUTANEOUS | Status: DC | PRN

## 2012-05-26 MED ORDER — ONDANSETRON HCL 4 MG PO TABS: 4.0000 mg | ORAL_TABLET | ORAL | Status: DC | PRN

## 2012-05-26 MED ORDER — LANOLIN HYDROUS EX OINT: TOPICAL_OINTMENT | CUTANEOUS | Status: DC | PRN

## 2012-05-26 NOTE — H&P (Signed)
Attestation of Attending Supervision of Advanced Practitioner (CNM/NP): Evaluation and management procedures were performed by the Advanced Practitioner under my supervision and collaboration.  I have reviewed the Advanced Practitioner's note and chart, and I agree with the management and plan.  HARRAWAY-SMITH, Liann Spaeth 9:54 AM     

## 2012-05-26 NOTE — MAU Provider Note (Signed)
Attestation of Attending Supervision of Advanced Practitioner (CNM/NP): Evaluation and management procedures were performed by the Advanced Practitioner under my supervision and collaboration.  I have reviewed the Advanced Practitioner's note and chart, and I agree with the management and plan.  HARRAWAY-SMITH, Reyli Schroth 9:53 AM

## 2012-05-26 NOTE — Anesthesia Procedure Notes (Signed)
Epidural Patient location during procedure: OB Start time: 05/26/2012 1:01 AM  Staffing Anesthesiologist: Brayton Caves R Performed by: anesthesiologist   Preanesthetic Checklist Completed: patient identified, site marked, surgical consent, pre-op evaluation, timeout performed, IV checked, risks and benefits discussed and monitors and equipment checked  Epidural Patient position: sitting Prep: site prepped and draped and DuraPrep Patient monitoring: continuous pulse ox and blood pressure Approach: midline Injection technique: LOR air and LOR saline  Needle:  Needle type: Tuohy  Needle gauge: 17 G Needle length: 9 cm and 9 Needle insertion depth: 9 cm Catheter type: closed end flexible Catheter size: 19 Gauge Catheter at skin depth: 15 cm Test dose: negative  Assessment Events: blood not aspirated, injection not painful, no injection resistance, negative IV test and no paresthesia  Additional Notes Patient identified.  Risk benefits discussed including failed block, incomplete pain control, headache, nerve damage, paralysis, blood pressure changes, nausea, vomiting, reactions to medication both toxic or allergic, and postpartum back pain.  Patient expressed understanding and wished to proceed.  All questions were answered.  Sterile technique used throughout procedure and epidural site dressed with sterile barrier dressing. No paresthesia or other complications noted.The patient did not experience any signs of intravascular injection such as tinnitus or metallic taste in mouth nor signs of intrathecal spread such as rapid motor block. Please see nursing notes for vital signs.

## 2012-05-26 NOTE — Anesthesia Preprocedure Evaluation (Signed)
Anesthesia Evaluation  Patient identified by MRN, date of birth, ID band Patient awake    Reviewed: Allergy & Precautions, H&P , Patient's Chart, lab work & pertinent test results  Airway Mallampati: III TM Distance: >3 FB Neck ROM: full    Dental No notable dental hx.    Pulmonary neg pulmonary ROS,  breath sounds clear to auscultation  Pulmonary exam normal       Cardiovascular hypertension, negative cardio ROS  Rhythm:regular Rate:Normal     Neuro/Psych negative neurological ROS  negative psych ROS   GI/Hepatic negative GI ROS, Neg liver ROS,   Endo/Other  negative endocrine ROSMorbid obesity  Renal/GU negative Renal ROS     Musculoskeletal   Abdominal   Peds  Hematology negative hematology ROS (+)   Anesthesia Other Findings   Reproductive/Obstetrics (+) Pregnancy                           Anesthesia Physical Anesthesia Plan  ASA: III  Anesthesia Plan: Epidural   Post-op Pain Management:    Induction:   Airway Management Planned:   Additional Equipment:   Intra-op Plan:   Post-operative Plan:   Informed Consent: I have reviewed the patients History and Physical, chart, labs and discussed the procedure including the risks, benefits and alternatives for the proposed anesthesia with the patient or authorized representative who has indicated his/her understanding and acceptance.     Plan Discussed with:   Anesthesia Plan Comments:         Anesthesia Quick Evaluation  

## 2012-05-26 NOTE — Anesthesia Postprocedure Evaluation (Signed)
Anesthesia Post Note  Patient: Cristina Murphy  Procedure(s) Performed: * No procedures listed *  Anesthesia type: Epidural  Patient location: Mother/Baby  Post pain: Pain level controlled  Post assessment: Post-op Vital signs reviewed  Last Vitals:  Filed Vitals:   05/26/12 1700  BP: 143/79  Pulse:   Temp:   Resp:     Post vital signs: Reviewed  Level of consciousness:alert  Complications: No apparent anesthesia complications

## 2012-05-26 NOTE — Progress Notes (Signed)
UR chart review completed.  

## 2012-05-26 NOTE — Progress Notes (Signed)
Patient ID: Cristina Murphy, female   DOB: June 03, 1986, 26 y.o.   MRN: [id number]  Progressed quickly to complete dilation/+1  FHR stable and reassuring  UCs every 2 min  Will begin pushing  Anticipate SVD

## 2012-05-27 LAB — CBC
HCT: 32.4 % — ABNORMAL LOW (ref 36.0–46.0)
Hemoglobin: 10.6 g/dL — ABNORMAL LOW (ref 12.0–15.0)
MCH: 28.8 pg (ref 26.0–34.0)
MCHC: 32.7 g/dL (ref 30.0–36.0)
RBC: 3.68 MIL/uL — ABNORMAL LOW (ref 3.87–5.11)

## 2012-05-27 MED ORDER — SODIUM CHLORIDE 0.9 % IJ SOLN: 3.0000 mL | INTRAMUSCULAR | Status: DC | PRN

## 2012-05-27 MED ORDER — LABETALOL HCL 200 MG PO TABS
200.0000 mg | ORAL_TABLET | Freq: Two times a day (BID) | ORAL | Status: DC
  Administered 2012-05-27 – 2012-05-28 (×3): 200 mg via ORAL
  Filled 2012-05-27 (×5): qty 1

## 2012-05-27 MED ORDER — SODIUM CHLORIDE 0.9 % IJ SOLN
3.0000 mL | Freq: Two times a day (BID) | INTRAMUSCULAR | Status: DC
  Administered 2012-05-27 (×2): 3 mL via INTRAVENOUS

## 2012-05-27 NOTE — Progress Notes (Signed)
SBar report to Jerrell Mylar, RN for pt transfer to [facility] #[id number].

## 2012-05-27 NOTE — Progress Notes (Signed)
  Post Partum Day 1 still on  Magnesium sulfate for HTN/Pre-E now 28 hours Subjective: no complaints, up ad lib, voiding, tolerating PO and ambulatory. denies headache, Ruq pain.   Objective: Blood pressure 158/84, pulse 93, temperature 97.4 F (36.3 C), temperature source Oral, resp. rate 18, height 5\' 4"  (1.626 m), weight 125.737 kg (277 lb 3.2 oz), last menstrual period 08/30/2011, SpO2 95.00%, unknown if currently breastfeeding.  Physical Exam:  General: alert, cooperative and no distress Lochia: appropriate Uterine Fundus: firm nat u-2 Incision: n/a DVT Evaluation: No evidence of DVT seen on physical exam.   Reflexes : 2+ without clonus.  Basename 05/27/12 0438 05/26/12 0355  HGB 10.6* 11.8*  HCT 32.4* 36.7    Assessment/Plan: PPD 1 s/p svd.  Preeclampsia, now with improved BP's Plan: D/C magnesium, transfer to Routine Mother Baby floor.   LOS: 3 days   Ytzel Gubler V 05/27/2012, 7:18 AM

## 2012-05-27 NOTE — Progress Notes (Signed)
Pt transferred ambulatory to RM [id number]

## 2012-05-28 MED ORDER — LABETALOL HCL 200 MG PO TABS: 200.0000 mg | ORAL_TABLET | Freq: Two times a day (BID) | ORAL | Status: DC

## 2012-05-28 MED ORDER — OXYCODONE-ACETAMINOPHEN 5-325 MG PO TABS: 1.0000 | ORAL_TABLET | ORAL | Status: DC | PRN

## 2012-05-28 MED ORDER — IBUPROFEN 600 MG PO TABS: 600.0000 mg | ORAL_TABLET | Freq: Four times a day (QID) | ORAL | Status: DC

## 2012-05-28 NOTE — Discharge Summary (Signed)
Obstetric Discharge Summary Reason for Admission: induction of labor Cristina Murphy is a 26 y.o. female presenting for lightheadedness and hazy vision.  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 Pt had PNV 2 days ago at [facility] and systolic BP was 150's- labs were drawn and urine was sent at that visit. Pt was instructed to f/u tomorrow. Patient reports no symptoms until this morning when driving to work. She suddenly felt light-headed and had some hazy vision. She thought it was from not eating, but experienced this again after she ate. She denies headache, spots in her vision, RUQ pain, nausea. No h/o HTN outside of pregnancy. No problems with BP until this most recent visit.  No ctx, no LOF, no vaginal bleeding. +FM  .Assessment/Plan:  26 yo G1P0 @ 38.2 (LMP and early U/S) p/w GHTN with severe range pressures  -Admit to L&D for IOL for severe range pressures and GHTN  -Mag 2/2 severe range pressures  -Labetalol PRN BP >160/110  -Cytotec for induction for unfavorable cervix    Prenatal Procedures: Intrapartum Procedures: Delivery Note  At 3:19 AM a viable and healthy female was delivered via Vaginal, Spontaneous Delivery (Presentation: ; Occiput Anterior). APGAR: 8, 9; weight 7 lb (3175 g).  Placenta status: Intact, Spontaneous. Cord: 3 vessels with the following complications: None. Cord pH: n/a  Anesthesia: Epidural  Episiotomy: None  Lacerations: small vaginal tear  Suture Repair: n/a, vaginal tear hemostatic  Est. Blood Loss (mL): 200  Mom to postpartum. Baby to nursery-stable.  Murphy,Cristina  05/26/2012, 5:09 AM     Postpartum Procedures: Magnesium Sulfate x 24 hours, and PO labetolol Complications-Operative and Postpartum: persistent Htn requiring Labetolol at discharge x 2 weeks Hemoglobin  Date Value Range Status  05/27/2012 10.6* 12.0 -  15.0 g/dL Final  9/60/4540 98.1   Final     HCT  Date Value Range Status  05/27/2012 32.4* 36.0 - 46.0 % Final  10/25/2011 36   Final    Physical Exam: BP 139/88  Pulse 93  Temp 98 F (36.7 C) (Oral)  Resp 18  Ht 5\' 4"  (1.626 m)  Wt 124.512 kg (274 lb 8 oz)  BMI 47.12 kg/m2  SpO2 97%  LMP 08/30/2011  Breastfeeding? Unknown  General: alert and no distress Lochia: appropriate Uterine Fundus: firm Incision: n/a DVT Evaluation: No evidence of DVT seen on physical exam.  Discharge Diagnoses: Term Pregnancy-delivered and Preelampsia  Discharge Information: Patient will be discharged home on labetalol 200 mg by mouth twice a day x2 weeks. She'll be seen in the office for blood pressure check in 5 days. 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 Patient notify our office for any blood pressures  in the 140 systolic or 90 diastolic range. Date: 05/28/2012 Activity: unrestricted and pelvic rest Diet: routine Medications: PNV, Ibuprofen and Labetolol 200 mg bid x 2 wk Condition: improved Instructions: limit activity at clinicals on tuesday and wednesday Discharge to: home   Newborn Data: Live born female  Birth Weight: 7 lb (3175 g) APGAR: 8, 9  Home with mother.  Cristina Murphy V 05/28/2012, 12:28 PM

## 2012-11-07 ENCOUNTER — Encounter (HOSPITAL_COMMUNITY): Payer: Self-pay | Admitting: Emergency Medicine

## 2012-11-07 ENCOUNTER — Emergency Department (HOSPITAL_COMMUNITY)
Admission: EM | Admit: 2012-11-07 | Discharge: 2012-11-07 | Disposition: A | Discharge status: Home / Self Care | Payer: Medicaid Other | Attending: Emergency Medicine | Admitting: Emergency Medicine

## 2012-11-07 DIAGNOSIS — R197 Diarrhea, unspecified: Secondary | ICD-10-CM | POA: Insufficient documentation

## 2012-11-07 DIAGNOSIS — K5289 Other specified noninfective gastroenteritis and colitis: Secondary | ICD-10-CM | POA: Insufficient documentation

## 2012-11-07 DIAGNOSIS — Z3202 Encounter for pregnancy test, result negative: Secondary | ICD-10-CM | POA: Insufficient documentation

## 2012-11-07 DIAGNOSIS — K529 Noninfective gastroenteritis and colitis, unspecified: Secondary | ICD-10-CM

## 2012-11-07 LAB — COMPREHENSIVE METABOLIC PANEL
ALT: 13 U/L (ref 0–35)
AST: 26 U/L (ref 0–37)
Albumin: 3.7 g/dL (ref 3.5–5.2)
Alkaline Phosphatase: 117 U/L (ref 39–117)
BUN: 11 mg/dL (ref 6–23)
CO2: 23 mEq/L (ref 19–32)
Calcium: 8.8 mg/dL (ref 8.4–10.5)
Chloride: 98 mEq/L (ref 96–112)
Creatinine, Ser: 0.8 mg/dL (ref 0.50–1.10)
GFR calc Af Amer: >90 mL/min (ref >90)
GFR calc non Af Amer: >90 mL/min (ref >90)
Glucose, Bld: 115 mg/dL — ABNORMAL HIGH (ref 70–99)
Potassium: 4.2 mEq/L (ref 3.5–5.1)
Sodium: 132 mEq/L — ABNORMAL LOW (ref 135–145)
Total Bilirubin: 0.4 mg/dL (ref 0.3–1.2)
Total Protein: 7.4 g/dL (ref 6.0–8.3)

## 2012-11-07 LAB — URINALYSIS, ROUTINE W REFLEX MICROSCOPIC
Bilirubin Urine: NEGATIVE
Glucose, UA: NEGATIVE mg/dL
Hgb urine dipstick: NEGATIVE
Ketones, ur: NEGATIVE mg/dL
Nitrite: NEGATIVE
Protein, ur: NEGATIVE mg/dL
Specific Gravity, Urine: 1.024 (ref 1.005–1.030)
Urobilinogen, UA: 1 mg/dL (ref 0.0–1.0)
pH: 7.5 (ref 5.0–8.0)

## 2012-11-07 LAB — CBC WITH DIFFERENTIAL/PLATELET
Basophils Absolute: 0 10*3/uL (ref 0.0–0.1)
Basophils Relative: 0 % (ref 0–1)
Eosinophils Absolute: 0 10*3/uL (ref 0.0–0.7)
Eosinophils Relative: 0 % (ref 0–5)
HCT: 40.8 % (ref 36.0–46.0)
Hemoglobin: 13.2 g/dL (ref 12.0–15.0)
Lymphocytes Relative: 4 % — ABNORMAL LOW (ref 12–46)
Lymphs Abs: 0.4 10*3/uL — ABNORMAL LOW (ref 0.7–4.0)
MCH: 27.6 pg (ref 26.0–34.0)
MCHC: 32.4 g/dL (ref 30.0–36.0)
MCV: 85.4 fL (ref 78.0–100.0)
Monocytes Absolute: 0.9 10*3/uL (ref 0.1–1.0)
Monocytes Relative: 7 % (ref 3–12)
Neutro Abs: 10.8 10*3/uL — ABNORMAL HIGH (ref 1.7–7.7)
Neutrophils Relative %: 89 % — ABNORMAL HIGH (ref 43–77)
Platelets: 297 10*3/uL (ref 150–400)
RBC: 4.78 MIL/uL (ref 3.87–5.11)
RDW: 12.6 % (ref 11.5–15.5)
WBC: 12.2 10*3/uL — ABNORMAL HIGH (ref 4.0–10.5)

## 2012-11-07 LAB — URINE MICROSCOPIC-ADD ON

## 2012-11-07 LAB — PREGNANCY, URINE: Preg Test, Ur: NEGATIVE

## 2012-11-07 MED ORDER — ONDANSETRON HCL 4 MG PO TABS: 4.0000 mg | ORAL_TABLET | Freq: Four times a day (QID) | ORAL | Status: DC

## 2012-11-07 MED ORDER — ONDANSETRON HCL 4 MG/2ML IJ SOLN
4.0000 mg | Freq: Once | INTRAMUSCULAR | Status: AC
  Administered 2012-11-07: 4 mg via INTRAVENOUS
  Filled 2012-11-07: qty 2

## 2012-11-07 MED ORDER — MORPHINE SULFATE 4 MG/ML IJ SOLN: 4.0000 mg | Freq: Once | INTRAMUSCULAR | Status: DC

## 2012-11-07 MED ORDER — SODIUM CHLORIDE 0.9 % IV BOLUS (SEPSIS)
2000.0000 mL | Freq: Once | INTRAVENOUS | Status: AC
  Administered 2012-11-07: 2000 mL via INTRAVENOUS

## 2012-11-07 MED ORDER — PROMETHAZINE HCL 25 MG/ML IJ SOLN
12.5000 mg | Freq: Once | INTRAMUSCULAR | Status: DC
  Filled 2012-11-07: qty 1

## 2012-11-07 NOTE — ED Notes (Signed)
Pt c/o of n/v/d that started around 1am. Pt also c/o of chills.

## 2012-11-07 NOTE — ED Notes (Signed)
Pt tolerated fluid challenge well. 

## 2012-11-07 NOTE — ED Provider Notes (Signed)
History     CSN: [id number]  Arrival date & time 11/07/12  8469   First MD Initiated Contact with Patient 11/07/12 0848      Chief Complaint  Patient presents with  . Emesis  . Diarrhea    (Consider location/radiation/quality/duration/timing/severity/associated sxs/prior treatment) HPI Patient presents emergency for nausea, vomiting, and diarrhea that started at 1 AM.  Patient, states she has not had any chest pain, shortness of breath, weakness, headache, visual changes, fever, cough, sore throat, back pain, or dysuria.  Patient, states she did not take anything prior to arrival.  She also, states she's not able to keep fluids down.  Patient, states nothing sees to make her condition worse other than trying to drink fluids. Past Medical History  Diagnosis Date  . No pertinent past medical history     Past Surgical History  Procedure Laterality Date  . No past surgeries      History reviewed. No pertinent family history.  History  Substance Use Topics  . Smoking status: Never Smoker   . Smokeless tobacco: Never Used  . Alcohol Use: No    OB History   Grav Para Term Preterm Abortions TAB SAB Ect Mult Living   1 1 1       1       Review of Systems All other systems negative except as documented in the HPI. All pertinent positives and negatives as reviewed in the HPI. Allergies  Review of patient's allergies indicates no known allergies.  Home Medications   Current Outpatient Rx  Name  Route  Sig  Dispense  Refill  . Prenatal Vit-Fe Fumarate-FA (MULTIVITAMIN-PRENATAL) 27-0.8 MG TABS   Oral   Take 1 tablet by mouth daily.           BP 130/69  Pulse 84  Temp(Src) 99 F (37.2 C)  Resp 20  SpO2 99%  Physical Exam  Nursing note and vitals reviewed. Constitutional: She is oriented to person, place, and time. She appears well-developed and well-nourished. No distress.  HENT:  Head: Normocephalic and atraumatic.  Mouth/Throat: Oropharynx is clear and moist.  No oropharyngeal exudate.  Eyes: Pupils are equal, round, and reactive to light.  Neck: Normal range of motion. Neck supple.  Cardiovascular: Normal rate, regular rhythm and normal heart sounds.  Exam reveals no gallop and no friction rub.   No murmur heard. Pulmonary/Chest: Effort normal and breath sounds normal. No respiratory distress.  Abdominal: Soft. Normal appearance and bowel sounds are normal. She exhibits no distension. There is no rigidity, no rebound, no guarding and no CVA tenderness. No hernia.  Neurological: She is alert and oriented to person, place, and time.  Skin: Skin is warm. No rash noted. No erythema.    ED Course  Procedures (including critical care time)  Labs Reviewed  COMPREHENSIVE METABOLIC PANEL - Abnormal; Notable for the following:    Sodium 132 (*)    Glucose, Bld 115 (*)    All other components within normal limits  CBC WITH DIFFERENTIAL - Abnormal; Notable for the following:    WBC 12.2 (*)    Neutrophils Relative 89 (*)    Neutro Abs 10.8 (*)    Lymphocytes Relative 4 (*)    Lymphs Abs 0.4 (*)    All other components within normal limits  URINALYSIS, ROUTINE W REFLEX MICROSCOPIC - Abnormal; Notable for the following:    APPearance CLOUDY (*)    Leukocytes, UA SMALL (*)    All other components within normal limits  URINE MICROSCOPIC-ADD ON - Abnormal; Notable for the following:    Bacteria, UA FEW (*)    All other components within normal limits  URINE CULTURE  PREGNANCY, URINE   The patient has been given an oral trial of fluids.  Patient is feeling better following medications and fluids here in the emergency department and advised to return here as needed.  For any worsening in her condition and also advised to slowly increase her fluid intake    MDM  MDM Reviewed: vitals and nursing note Interpretation: labs            Carlyle Dolly, PA-C 11/12/12 2348

## 2012-11-08 LAB — URINE CULTURE

## 2012-11-14 NOTE — ED Provider Notes (Signed)
Medical screening examination/treatment/procedure(s) were performed by non-physician practitioner and as supervising physician I was immediately available for consultation/collaboration.  Ethelda Chick, MD 11/14/12 [phone]

## 2013-01-22 ENCOUNTER — Ambulatory Visit: Payer: Self-pay | Admitting: Adult Health

## 2013-01-25 ENCOUNTER — Encounter: Payer: Self-pay | Admitting: Obstetrics & Gynecology

## 2013-02-16 ENCOUNTER — Encounter: Payer: Self-pay | Admitting: *Deleted

## 2013-02-19 ENCOUNTER — Other Ambulatory Visit: Payer: Self-pay | Admitting: Women's Health

## 2013-06-09 ENCOUNTER — Inpatient Hospital Stay (HOSPITAL_COMMUNITY)
Admission: AD | Admit: 2013-06-09 | Discharge: 2013-06-09 | Disposition: A | Discharge status: Home / Self Care | Payer: Medicaid Other | Source: Ambulatory Visit | Attending: Obstetrics and Gynecology | Admitting: Obstetrics and Gynecology

## 2013-06-09 ENCOUNTER — Encounter (HOSPITAL_COMMUNITY): Payer: Self-pay | Admitting: *Deleted

## 2013-06-09 DIAGNOSIS — R109 Unspecified abdominal pain: Secondary | ICD-10-CM | POA: Insufficient documentation

## 2013-06-09 DIAGNOSIS — O26859 Spotting complicating pregnancy, unspecified trimester: Secondary | ICD-10-CM | POA: Insufficient documentation

## 2013-06-09 DIAGNOSIS — O4690 Antepartum hemorrhage, unspecified, unspecified trimester: Secondary | ICD-10-CM

## 2013-06-09 DIAGNOSIS — O4693 Antepartum hemorrhage, unspecified, third trimester: Secondary | ICD-10-CM

## 2013-06-09 HISTORY — DX: Other specified health status: Z78.9

## 2013-06-09 MED ORDER — NIFEDIPINE 10 MG PO CAPS
ORAL_CAPSULE | ORAL | Status: AC
  Administered 2013-06-09: 10 mg via ORAL
  Filled 2013-06-09: qty 1

## 2013-06-09 MED ORDER — NIFEDIPINE 10 MG PO CAPS: 10.0000 mg | ORAL_CAPSULE | Freq: Once | ORAL | Status: AC

## 2013-06-09 NOTE — MAU Provider Note (Signed)
  History     CSN: [id number]  Arrival date and time: 06/09/13 4098   First Provider Initiated Contact with Patient 06/09/13 2022      Chief Complaint  Patient presents with  . Vaginal Bleeding   HPI This is a 27 y.o. female at [redacted]w[redacted]d who presents with c/o red spotting with wiping x 1 this evening. Has felt "menstrual" like cramping since 7pm.  Last delivery was a year ago at term. No recent IC or exams. Saw Dr Senaida Ores 2 days ago.   RN Note: Pt with c/o bleeding with wiping tonight at movies. Pt denies srom, pih symptoms or other concerns. + fm per pt.       OB History   Grav Para Term Preterm Abortions TAB SAB Ect Mult Living   2 1 1       1       Past Medical History  Diagnosis Date  . No pertinent past medical history   . Medical history non-contributory     Past Surgical History  Procedure Laterality Date  . No past surgeries      Family History  Problem Relation Age of Onset  . Hypertension Mother   . Hypertension Father     History  Substance Use Topics  . Smoking status: Never Smoker   . Smokeless tobacco: Never Used  . Alcohol Use: No    Allergies: No Known Allergies  Prescriptions prior to admission  Medication Sig Dispense Refill  . Prenatal Vit-Fe Fumarate-FA (PRENATAL MULTIVITAMIN) TABS tablet Take 1 tablet by mouth daily.        Review of Systems  Constitutional: Negative for fever, chills and malaise/fatigue.  Gastrointestinal: Positive for abdominal pain (mild menstrual like cramping). Negative for nausea, vomiting, diarrhea and constipation.  Genitourinary: Negative for dysuria.   Physical Exam   Blood pressure 140/73, pulse 92, temperature 98 F (36.7 C), temperature source Oral, resp. rate 18, height 5\' 7"  (1.702 m), weight 125.011 kg (275 lb 9.6 oz).  Physical Exam  Constitutional: She is oriented to person, place, and time. She appears well-developed and well-nourished. No distress.  HENT:  Head: Normocephalic.   Cardiovascular: Normal rate.   Respiratory: Effort normal.  GI: Soft. She exhibits no distension. There is no tenderness. There is no rebound and no guarding.  Fetal heart rate reactive Uterine irritability with 10 second cramps seen One contraction seen, pt did not feel it  Genitourinary: Uterus normal. Vaginal discharge (creamy white, no color seen at all) found.  Cervix posterior, long/closed/soft   Musculoskeletal: Normal range of motion.  Neurological: She is alert and oriented to person, place, and time.  Skin: Skin is warm and dry.  Psychiatric: She has a normal mood and affect.    MAU Course  Procedures  MDM Consulted Dr Senaida Ores:  One dose of Procardia given to decrease the cramping Pt reports cramps are gone after med given   Assessment and Plan  A:  SIUP at [redacted]w[redacted]d      Spotting x 1      No evidence of current bleeding      Uterine irritability  P:  Discharge home      Pelvic rest      Monitor for PTL and call MD if returns      Follow up in office  [facility] 06/09/2013, 8:34 PM

## 2013-06-09 NOTE — MAU Note (Signed)
Pt with c/o bleeding with wiping tonight at movies.  Pt denies srom, pih symptoms or other concerns.  + fm per pt.

## 2013-08-16 ENCOUNTER — Inpatient Hospital Stay (HOSPITAL_COMMUNITY): Payer: Medicaid Other

## 2013-08-16 ENCOUNTER — Encounter (HOSPITAL_COMMUNITY): Payer: Medicaid Other | Admitting: Anesthesiology

## 2013-08-16 ENCOUNTER — Inpatient Hospital Stay (HOSPITAL_COMMUNITY)
Admission: AD | Admit: 2013-08-16 | Discharge: 2013-08-18 | DRG: 774 | Disposition: A | Discharge status: Home / Self Care | Payer: Medicaid Other | Source: Ambulatory Visit | Attending: Obstetrics and Gynecology | Admitting: Obstetrics and Gynecology

## 2013-08-16 ENCOUNTER — Encounter (HOSPITAL_COMMUNITY): Payer: Self-pay | Admitting: *Deleted

## 2013-08-16 ENCOUNTER — Inpatient Hospital Stay (HOSPITAL_COMMUNITY): Payer: Medicaid Other | Admitting: Anesthesiology

## 2013-08-16 DIAGNOSIS — O479 False labor, unspecified: Secondary | ICD-10-CM

## 2013-08-16 DIAGNOSIS — Z22330 Carrier of Group B streptococcus: Secondary | ICD-10-CM

## 2013-08-16 DIAGNOSIS — O4690 Antepartum hemorrhage, unspecified, unspecified trimester: Principal | ICD-10-CM | POA: Diagnosis present

## 2013-08-16 DIAGNOSIS — O99892 Other specified diseases and conditions complicating childbirth: Secondary | ICD-10-CM | POA: Diagnosis present

## 2013-08-16 DIAGNOSIS — O1002 Pre-existing essential hypertension complicating childbirth: Secondary | ICD-10-CM | POA: Diagnosis present

## 2013-08-16 HISTORY — DX: Essential (primary) hypertension: I10

## 2013-08-16 LAB — COMPREHENSIVE METABOLIC PANEL
BUN: 8 mg/dL (ref 6–23)
CO2: 21 mEq/L (ref 19–32)
Calcium: 8.9 mg/dL (ref 8.4–10.5)
Creatinine, Ser: 0.68 mg/dL (ref 0.50–1.10)
GFR calc Af Amer: >90 mL/min (ref >90)
GFR calc non Af Amer: >90 mL/min (ref >90)
Glucose, Bld: 89 mg/dL (ref 70–99)

## 2013-08-16 LAB — CBC
MCH: 27.9 pg (ref 26.0–34.0)
MCV: 84.5 fL (ref 78.0–100.0)
Platelets: 206 10*3/uL (ref 150–400)
RDW: 14 % (ref 11.5–15.5)
WBC: 12.3 10*3/uL — ABNORMAL HIGH (ref 4.0–10.5)

## 2013-08-16 LAB — URINALYSIS, ROUTINE W REFLEX MICROSCOPIC
Nitrite: NEGATIVE
Specific Gravity, Urine: <1.005 — ABNORMAL LOW (ref 1.005–1.030)
Urobilinogen, UA: 0.2 mg/dL (ref 0.0–1.0)

## 2013-08-16 LAB — URINE MICROSCOPIC-ADD ON

## 2013-08-16 LAB — RPR: RPR Ser Ql: NONREACTIVE

## 2013-08-16 LAB — PROTEIN / CREATININE RATIO, URINE
Creatinine, Urine: 40.87 mg/dL
Total Protein, Urine: 7.7 mg/dL

## 2013-08-16 LAB — TYPE AND SCREEN

## 2013-08-16 MED ORDER — PRENATAL MULTIVITAMIN CH: 1.0000 | ORAL_TABLET | Freq: Every day | ORAL | Status: DC

## 2013-08-16 MED ORDER — CITRIC ACID-SODIUM CITRATE 334-500 MG/5ML PO SOLN: 30.0000 mL | ORAL | Status: DC | PRN

## 2013-08-16 MED ORDER — SODIUM CHLORIDE 0.9 % IV SOLN
2.0000 g | Freq: Once | INTRAVENOUS | Status: AC
  Administered 2013-08-16: 2 g via INTRAVENOUS
  Filled 2013-08-16: qty 2000

## 2013-08-16 MED ORDER — ACETAMINOPHEN 325 MG PO TABS: 650.0000 mg | ORAL_TABLET | ORAL | Status: DC | PRN

## 2013-08-16 MED ORDER — OXYTOCIN 40 UNITS IN LACTATED RINGERS INFUSION - SIMPLE MED
1.0000 m[IU]/min | INTRAVENOUS | Status: DC
  Administered 2013-08-16: 1 m[IU]/min via INTRAVENOUS

## 2013-08-16 MED ORDER — PHENYLEPHRINE 40 MCG/ML (10ML) SYRINGE FOR IV PUSH (FOR BLOOD PRESSURE SUPPORT)
80.0000 ug | PREFILLED_SYRINGE | INTRAVENOUS | Status: DC | PRN
  Filled 2013-08-16: qty 2

## 2013-08-16 MED ORDER — IBUPROFEN 600 MG PO TABS: 600.0000 mg | ORAL_TABLET | Freq: Four times a day (QID) | ORAL | Status: DC | PRN

## 2013-08-16 MED ORDER — OXYTOCIN BOLUS FROM INFUSION
500.0000 mL | INTRAVENOUS | Status: DC
  Administered 2013-08-16: 500 mL via INTRAVENOUS

## 2013-08-16 MED ORDER — OXYCODONE-ACETAMINOPHEN 5-325 MG PO TABS
1.0000 | ORAL_TABLET | ORAL | Status: DC | PRN
  Administered 2013-08-16 – 2013-08-17 (×2): 1 via ORAL
  Filled 2013-08-16 (×2): qty 1

## 2013-08-16 MED ORDER — PHENYLEPHRINE 40 MCG/ML (10ML) SYRINGE FOR IV PUSH (FOR BLOOD PRESSURE SUPPORT)
80.0000 ug | PREFILLED_SYRINGE | INTRAVENOUS | Status: DC | PRN
  Filled 2013-08-16: qty 2
  Filled 2013-08-16: qty 10

## 2013-08-16 MED ORDER — TERBUTALINE SULFATE 1 MG/ML IJ SOLN: 0.2500 mg | Freq: Once | INTRAMUSCULAR | Status: DC | PRN

## 2013-08-16 MED ORDER — TETANUS-DIPHTH-ACELL PERTUSSIS 5-2.5-18.5 LF-MCG/0.5 IM SUSP: 0.5000 mL | Freq: Once | INTRAMUSCULAR | Status: DC

## 2013-08-16 MED ORDER — DIPHENHYDRAMINE HCL 50 MG/ML IJ SOLN: 12.5000 mg | INTRAMUSCULAR | Status: DC | PRN

## 2013-08-16 MED ORDER — EPHEDRINE 5 MG/ML INJ
10.0000 mg | INTRAVENOUS | Status: DC | PRN
  Filled 2013-08-16: qty 4
  Filled 2013-08-16: qty 2

## 2013-08-16 MED ORDER — DIPHENHYDRAMINE HCL 25 MG PO CAPS: 25.0000 mg | ORAL_CAPSULE | Freq: Four times a day (QID) | ORAL | Status: DC | PRN

## 2013-08-16 MED ORDER — ZOLPIDEM TARTRATE 5 MG PO TABS: 5.0000 mg | ORAL_TABLET | Freq: Every evening | ORAL | Status: DC | PRN

## 2013-08-16 MED ORDER — LACTATED RINGERS IV SOLN: INTRAVENOUS | Status: DC

## 2013-08-16 MED ORDER — OXYTOCIN 40 UNITS IN LACTATED RINGERS INFUSION - SIMPLE MED
62.5000 mL/h | INTRAVENOUS | Status: DC
  Filled 2013-08-16: qty 1000

## 2013-08-16 MED ORDER — MEASLES, MUMPS & RUBELLA VAC ~~LOC~~ INJ: 0.5000 mL | INJECTION | Freq: Once | SUBCUTANEOUS | Status: DC

## 2013-08-16 MED ORDER — SENNOSIDES-DOCUSATE SODIUM 8.6-50 MG PO TABS
2.0000 | ORAL_TABLET | ORAL | Status: DC
  Administered 2013-08-16: 2 via ORAL
  Filled 2013-08-16: qty 2

## 2013-08-16 MED ORDER — LACTATED RINGERS IV SOLN: INTRAVENOUS | Status: AC

## 2013-08-16 MED ORDER — SIMETHICONE 80 MG PO CHEW: 80.0000 mg | CHEWABLE_TABLET | ORAL | Status: DC | PRN

## 2013-08-16 MED ORDER — ONDANSETRON HCL 4 MG/2ML IJ SOLN: 4.0000 mg | Freq: Four times a day (QID) | INTRAMUSCULAR | Status: DC | PRN

## 2013-08-16 MED ORDER — EPHEDRINE 5 MG/ML INJ
10.0000 mg | INTRAVENOUS | Status: DC | PRN
  Filled 2013-08-16: qty 2

## 2013-08-16 MED ORDER — DIBUCAINE 1 % RE OINT: 1.0000 "application " | TOPICAL_OINTMENT | RECTAL | Status: DC | PRN

## 2013-08-16 MED ORDER — OXYTOCIN 40 UNITS IN LACTATED RINGERS INFUSION - SIMPLE MED: 62.5000 mL/h | INTRAVENOUS | Status: AC | PRN

## 2013-08-16 MED ORDER — FLEET ENEMA 7-19 GM/118ML RE ENEM: 1.0000 | ENEMA | RECTAL | Status: DC | PRN

## 2013-08-16 MED ORDER — LIDOCAINE HCL (PF) 1 % IJ SOLN
INTRAMUSCULAR | Status: DC | PRN
  Administered 2013-08-16 (×2): 4 mL

## 2013-08-16 MED ORDER — ONDANSETRON HCL 4 MG/2ML IJ SOLN: 4.0000 mg | INTRAMUSCULAR | Status: DC | PRN

## 2013-08-16 MED ORDER — LANOLIN HYDROUS EX OINT: TOPICAL_OINTMENT | CUTANEOUS | Status: DC | PRN

## 2013-08-16 MED ORDER — PRENATAL MULTIVITAMIN CH
1.0000 | ORAL_TABLET | Freq: Every day | ORAL | Status: DC
  Administered 2013-08-17: 1 via ORAL
  Filled 2013-08-16 (×2): qty 1

## 2013-08-16 MED ORDER — FENTANYL 2.5 MCG/ML BUPIVACAINE 1/10 % EPIDURAL INFUSION (WH - ANES)
INTRAMUSCULAR | Status: DC | PRN
  Administered 2013-08-16: 14 mL/h via EPIDURAL

## 2013-08-16 MED ORDER — ONDANSETRON HCL 4 MG PO TABS
4.0000 mg | ORAL_TABLET | ORAL | Status: DC | PRN
  Administered 2013-08-16: 4 mg via ORAL
  Filled 2013-08-16: qty 1

## 2013-08-16 MED ORDER — FENTANYL 2.5 MCG/ML BUPIVACAINE 1/10 % EPIDURAL INFUSION (WH - ANES)
14.0000 mL/h | INTRAMUSCULAR | Status: DC | PRN
  Filled 2013-08-16: qty 125

## 2013-08-16 MED ORDER — LACTATED RINGERS IV SOLN: 500.0000 mL | Freq: Once | INTRAVENOUS | Status: DC

## 2013-08-16 MED ORDER — NALBUPHINE SYRINGE 5 MG/0.5 ML
10.0000 mg | INJECTION | Freq: Once | INTRAMUSCULAR | Status: AC
  Administered 2013-08-16: 10 mg via INTRAVENOUS
  Filled 2013-08-16: qty 1

## 2013-08-16 MED ORDER — LACTATED RINGERS IV SOLN: 500.0000 mL | INTRAVENOUS | Status: DC | PRN

## 2013-08-16 MED ORDER — IBUPROFEN 600 MG PO TABS
600.0000 mg | ORAL_TABLET | Freq: Four times a day (QID) | ORAL | Status: DC
  Administered 2013-08-16 – 2013-08-18 (×8): 600 mg via ORAL
  Filled 2013-08-16 (×9): qty 1

## 2013-08-16 MED ORDER — LACTATED RINGERS IV SOLN
INTRAVENOUS | Status: DC
  Administered 2013-08-16: 05:00:00 via INTRAVENOUS

## 2013-08-16 MED ORDER — OXYCODONE-ACETAMINOPHEN 5-325 MG PO TABS: 1.0000 | ORAL_TABLET | ORAL | Status: DC | PRN

## 2013-08-16 MED ORDER — LIDOCAINE HCL (PF) 1 % IJ SOLN
30.0000 mL | INTRAMUSCULAR | Status: DC | PRN
  Filled 2013-08-16: qty 30

## 2013-08-16 MED ORDER — BENZOCAINE-MENTHOL 20-0.5 % EX AERO
1.0000 "application " | INHALATION_SPRAY | CUTANEOUS | Status: DC | PRN
  Administered 2013-08-16: 1 via TOPICAL
  Filled 2013-08-16: qty 56

## 2013-08-16 MED ORDER — WITCH HAZEL-GLYCERIN EX PADS: 1.0000 "application " | MEDICATED_PAD | CUTANEOUS | Status: DC | PRN

## 2013-08-16 NOTE — Progress Notes (Signed)
Patient ID: Cristina Murphy, female   DOB: 1986/03/04, 27 y.o.   MRN: [id number] Pt was admitted after cervical change from 2 cm to 4 cm with painful contractions. The cervix is now 8 cm 100% effaced and the membranes are bulging. AROM produced bloody fluid with a 4 cm clot.

## 2013-08-16 NOTE — H&P (Signed)
Cristina Murphy is a 27 y.o. female G2P1001 at 18 1/7 weeks (EDD 08/29/13 by 11 week [place]) presenting for regular contractions with cervical change and vaginal bleeding.  Pt awoke at 3 am with a small puddle of bright red bleeding and came to hospital.  On arrival, blood was in vagina but not active.  FHR is reactive, and limited US showed no abruption.  SHe then began to contract regularly and cervix changes from 2cm to 4+cm/90 effaced and she requested pain medications.  She received a dose of nubain.  Her prenantal care has otherwise been significant for probable borderline CHTN with BP 140-150/80's on several visits throughout. She has had no proteinuria or symptoms.  SHe had PIH with her last pregnancy and was induced at 38 weeks.  SHe is also GBS positive.  Maternal Medical History:  Reason for admission: Contractions and vaginal bleeding.   Contractions: Onset was 3-5 hours ago.   Frequency: regular.    Fetal activity: Perceived fetal activity is normal.    Prenatal complications: Bleeding.   Prenatal Complications - Diabetes: none.    OB History   Grav Para Term Preterm Abortions TAB SAB Ect Mult Living   2 1 1       1     2013 NSVD 7# PIH  Past Medical History  Diagnosis Date  . No pertinent past medical history   . Medical history non-contributory   . Hypertension     Preeclampsia 2013   Past Surgical History  Procedure Laterality Date  . No past surgeries     Family History: family history includes Hypertension in her father and mother. Social History:  reports that she has never smoked. She has never used smokeless tobacco. She reports that she does not drink alcohol or use illicit drugs.   Prenatal Transfer Tool  Maternal Diabetes: No Genetic Screening: Normal Maternal Ultrasounds/Referrals: Normal Fetal Ultrasounds or other Referrals:  None Maternal Substance Abuse:  No Significant Maternal Medications:  None Significant Maternal Lab Results:  Lab values include:  Group B Strep positive Other Comments:  None  Review of Systems  Neurological: Negative for headaches.    Dilation: 4.5 Effacement (%): 90 Station: -2 Exam by:: B Mosca Blood pressure 144/88, pulse 84, height 5\' 4"  (1.626 m), weight 132.541 kg (292 lb 3.2 oz). Maternal Exam:  Uterine Assessment: Contraction strength is moderate.  Contraction frequency is regular.   Abdomen: Fetal presentation: vertex  Introitus: Normal vulva. Normal vagina.    Physical Exam  Constitutional: She is oriented to person, place, and time. She appears well-developed and well-nourished.  Cardiovascular: Normal rate and regular rhythm.   Respiratory: Effort normal.  GI: Soft.  Genitourinary: Vagina normal and uterus normal.  Neurological: She is alert and oriented to person, place, and time.  Psychiatric: She has a normal mood and affect. Her behavior is normal.    Prenatal labs: ABO, Rh: AB Antibody: positive Rubella:  Immune RPR:   NR HBsAg:  Neg  HIV:   NR GBS:   Positive First trimester screen and AFP WNL One hour GTT 102  Assessment/Plan: Pt is admitted with vaginal bleeding followed by contractions and cervical change.  PIH labs are WNL and BP 140/80's.  She has requested and will receive an epidural.  Ampicillin for +GBS.   Oliver Pila 08/16/2013, 6:55 AM

## 2013-08-16 NOTE — MAU Provider Note (Signed)
Chief Complaint:  Vaginal Bleeding and Abdominal Cramping   First Provider Initiated Contact with Patient 08/16/13 0414      HPI: Cristina Murphy is a 27 y.o. G2P1001 at [redacted]w[redacted]d who presents to maternity admissions reporting waking up to having a small puddle of bright red blood in her bed at 0305. Does not think it was mixed w/ amniotic fluid or mucus. Lighter bleeding since. Mild contractions. Denies elevated BP's this pregnancy, but BP 150's systolic in office yesterday.   Denies HA, vision changes, epigastric pain, leakage of fluid or vaginal bleeding. Good fetal movement.   AB pos per PNR. GBS pos. Placenta anterior per anatomy scan.  Pregnancy Course: Hx Pre-E w/ first baby.   Past Medical History: Past Medical History  Diagnosis Date  . No pertinent past medical history   . Hypertension     Preeclampsia 2013    Past obstetric history: OB History  Gravida Para Term Preterm AB SAB TAB Ectopic Multiple Living  2 1 1       1     # Outcome Date GA Lbr Len/2nd Weight Sex Delivery Anes PTL Lv  2 CUR           1 TRM 05/26/12 [redacted]w[redacted]d 01:05 / 00:29 3.175 kg (7 lb) M SVD EPI  Y      Past Surgical History: Past Surgical History  Procedure Laterality Date  . No past surgeries       Family History: Family History  Problem Relation Age of Onset  . Hypertension Mother   . Hypertension Father     Social History: History  Substance Use Topics  . Smoking status: Never Smoker   . Smokeless tobacco: Never Used  . Alcohol Use: No    Allergies: No Known Allergies  Meds:  Prescriptions prior to admission  Medication Sig Dispense Refill  . Prenatal Vit-Fe Fumarate-FA (PRENATAL MULTIVITAMIN) TABS tablet Take 1 tablet by mouth daily.        ROS: Pertinent findings in history of present illness.  Physical Exam  Blood pressure 144/88, pulse 84, height 5\' 4"  (1.626 m), weight 132.541 kg (292 lb 3.2 oz). GENERAL: Well-developed, well-nourished female in no acute distress. Very  anxious.  HEENT: normocephalic HEART: normal rate RESP: normal effort ABDOMEN: Soft, non-tender, gravid appropriate for gestational age EXTREMITIES: Nontender, 1+ edema NEURO: alert and oriented. DTRs 2+ no clonus.  SPECULUM EXAM: NEFG, small amount of bright red blood, exam limited by pt intolerance. Cervix not visualized.  2/long/-3, soft, very posterior, UTA presentation.   FHT:  Baseline 140 , moderate variability, accelerations present, no decelerations Contractions: q 7-9 mins, mild   Labs: Results for orders placed during the hospital encounter of 08/16/13 (from the past 24 hour(s))  POCT FERN TEST     Status: None   Collection Time    08/16/13  4:20 AM      Result Value Range   POCT Fern Test Negative = intact amniotic membranes    CBC     Status: Abnormal   Collection Time    08/16/13  4:40 AM      Result Value Range   WBC 12.3 (*) 4.0 - 10.5 K/uL   RBC 3.73 (*) 3.87 - 5.11 MIL/uL   Hemoglobin 10.4 (*) 12.0 - 15.0 g/dL   HCT 40.9 (*) 81.1 - 91.4 %   MCV 84.5  78.0 - 100.0 fL   MCH 27.9  26.0 - 34.0 pg   MCHC 33.0  30.0 - 36.0 g/dL  RDW 14.0  11.5 - 15.5 %   Platelets 206  150 - 400 K/uL  TYPE AND SCREEN     Status: None   Collection Time    08/16/13  4:40 AM      Result Value Range   ABO/RH(D) O POS     Antibody Screen NEG     Sample Expiration 08/19/2013    COMPREHENSIVE METABOLIC PANEL     Status: Abnormal   Collection Time    08/16/13  4:40 AM      Result Value Range   Sodium 135  135 - 145 mEq/L   Potassium 3.9  3.5 - 5.1 mEq/L   Chloride 103  96 - 112 mEq/L   CO2 21  19 - 32 mEq/L   Glucose, Bld 89  70 - 99 mg/dL   BUN 8  6 - 23 mg/dL   Creatinine, Ser 4.09  0.50 - 1.10 mg/dL   Calcium 8.9  8.4 - 81.1 mg/dL   Total Protein 6.2  6.0 - 8.3 g/dL   Albumin 2.4 (*) 3.5 - 5.2 g/dL   AST 16  0 - 37 U/L   ALT 12  0 - 35 U/L   Alkaline Phosphatase 165 (*) 39 - 117 U/L   Total Bilirubin 0.2 (*) 0.3 - 1.2 mg/dL   GFR calc non Af Amer >90  >90 mL/min    GFR calc Af Amer >90  >90 mL/min  URINALYSIS, ROUTINE W REFLEX MICROSCOPIC     Status: Abnormal   Collection Time    08/16/13  6:10 AM      Result Value Range   Color, Urine YELLOW  YELLOW   APPearance CLEAR  CLEAR   Specific Gravity, Urine <1.005 (*) 1.005 - 1.030   pH 5.5  5.0 - 8.0   Glucose, UA NEGATIVE  NEGATIVE mg/dL   Hgb urine dipstick LARGE (*) NEGATIVE   Bilirubin Urine NEGATIVE  NEGATIVE   Ketones, ur NEGATIVE  NEGATIVE mg/dL   Protein, ur NEGATIVE  NEGATIVE mg/dL   Urobilinogen, UA 0.2  0.0 - 1.0 mg/dL   Nitrite NEGATIVE  NEGATIVE   Leukocytes, UA MODERATE (*) NEGATIVE  URINE MICROSCOPIC-ADD ON     Status: None   Collection Time    08/16/13  6:10 AM      Result Value Range   Squamous Epithelial / LPF RARE  RARE   WBC, UA 3-6  <3 WBC/hpf   RBC / HPF 3-6  <3 RBC/hpf   Bacteria, UA RARE  RARE    Imaging:  No evidence of abruption. Vtx. AFI 12.44 cm.  MAU Course: CBC, type and screen, IV, OB limited. Notified Dr. Senaida Ores of bleeding, exam, [place] results and mildly elevated BP. Ordered CMET and UA.   Contractions stronger and closer. Asking for pain meds. Nubain ordered. Labs pending.  Cervix changed to 4/90 per RN. Bleeding stable.   Blood type AB pos per PNR, but O pos x 2 in epic.   Assessment: Third trimester bleeding.  Early labor CHTN w/out evidence of superimposed Pre-eclampsia  Plan: Admit to [facility] per Dr. Senaida Ores.  Dr. Ambrose Mantle assuming care of pt. Notified of discrepancy btw blood type on PRN and labs in Epic. Will investigate further.   [place], CNM 08/16/2013 7:16 AM

## 2013-08-16 NOTE — Anesthesia Preprocedure Evaluation (Signed)
Anesthesia Evaluation  Patient identified by MRN, date of birth, ID band Patient awake    Reviewed: Allergy & Precautions, H&P , NPO status , Patient's Chart, lab work & pertinent test results  Airway Mallampati: III TM Distance: >3 FB Neck ROM: full    Dental no notable dental hx. (+) Teeth Intact   Pulmonary neg pulmonary ROS,  breath sounds clear to auscultation  Pulmonary exam normal       Cardiovascular hypertension, negative cardio ROS  Rhythm:regular Rate:Normal     Neuro/Psych negative neurological ROS  negative psych ROS   GI/Hepatic negative GI ROS, Neg liver ROS,   Endo/Other  Morbid obesity  Renal/GU negative Renal ROS  negative genitourinary   Musculoskeletal   Abdominal Normal abdominal exam  (+)   Peds  Hematology negative hematology ROS (+)   Anesthesia Other Findings   Reproductive/Obstetrics (+) Pregnancy                           Anesthesia Physical Anesthesia Plan  ASA: II  Anesthesia Plan: Epidural   Post-op Pain Management:    Induction:   Airway Management Planned:   Additional Equipment:   Intra-op Plan:   Post-operative Plan:   Informed Consent: I have reviewed the patients History and Physical, chart, labs and discussed the procedure including the risks, benefits and alternatives for the proposed anesthesia with the patient or authorized representative who has indicated his/her understanding and acceptance.     Plan Discussed with: Anesthesiologist  Anesthesia Plan Comments:         Anesthesia Quick Evaluation

## 2013-08-16 NOTE — Anesthesia Procedure Notes (Signed)
Epidural Patient location during procedure: OB Start time: 08/16/2013 7:57 AM  Staffing Anesthesiologist: Davelle Anselmi A. Performed by: anesthesiologist   Preanesthetic Checklist Completed: patient identified, site marked, surgical consent, pre-op evaluation, timeout performed, IV checked, risks and benefits discussed and monitors and equipment checked  Epidural Patient position: sitting Prep: site prepped and draped and DuraPrep Patient monitoring: continuous pulse ox and blood pressure Approach: midline Injection technique: LOR air  Needle:  Needle type: Tuohy  Needle gauge: 17 G Needle length: 9 cm and 9 Needle insertion depth: 9 cm Catheter type: closed end flexible Catheter size: 19 Gauge Catheter at skin depth: 14 cm Test dose: negative and Other  Assessment Events: blood not aspirated, injection not painful, no injection resistance, negative IV test and no paresthesia  Additional Notes Patient identified. Risks and benefits discussed including failed block, incomplete  Pain control, post dural puncture headache, nerve damage, paralysis, blood pressure Changes, nausea, vomiting, reactions to medications-both toxic and allergic and post Partum back pain. All questions were answered. Patient expressed understanding and wished to proceed. Sterile technique was used throughout procedure. Epidural site was Dressed with sterile barrier dressing. No paresthesias, signs of intravascular injection Or signs of intrathecal spread were encountered.  Patient was more comfortable after the epidural was dosed. Please see RN's note for documentation of vital signs and FHR which are stable.

## 2013-08-16 NOTE — Progress Notes (Signed)
Patient ID: Cristina Murphy, female   DOB: 07-02-1986, 27 y.o.   MRN: [id number] Delivery note:  The pt became fully dilated but her contractions were q 4 minutes so pitocin was begun and the contractions became closer. Initially, the pt pushed poorly but subsequently, her pushing improved and she delivered spontaneously ROA over an intact perineum a living female infant with Apgars 9 and 9 at 1 and 5 minutes. The placenta delivered intact and was sent to pathology because of the bleeding The uterus was normal . There were no lacerations. EBL 400 cc's.

## 2013-08-16 NOTE — MAU Note (Signed)
Pt woke up this morning with heavy vaginal bleeding & abdominal cramping. Decreased fetal movement tonight. Denies complications with pregnancy. No contractions, states doesn't feel like labor.

## 2013-08-16 NOTE — Anesthesia Postprocedure Evaluation (Signed)
  Anesthesia Post-op Note  Anesthesia Post Note  Patient: Cristina Murphy  Procedure(s) Performed: * No procedures listed *  Anesthesia type: Epidural  Patient location: Mother/Baby  Post pain: Pain level controlled  Post assessment: Post-op Vital signs reviewed  Last Vitals:  Filed Vitals:   08/16/13 1236  BP: 139/86  Pulse: 88  Temp: 36.6 C  Resp: 18    Post vital signs: Reviewed  Level of consciousness:alert  Complications: No apparent anesthesia complications

## 2013-08-16 NOTE — MAU Note (Signed)
Pt woke up at 0305 and had "a small puddle" of blood in her bed. Pt went to the bathroom and "had bleeding like a period". Since then the bleeding has "gotten lighter"

## 2013-08-17 LAB — CBC
HCT: 31.4 % — ABNORMAL LOW (ref 36.0–46.0)
Hemoglobin: 10.1 g/dL — ABNORMAL LOW (ref 12.0–15.0)
MCHC: 32.2 g/dL (ref 30.0–36.0)
MCV: 85.3 fL (ref 78.0–100.0)
RBC: 3.68 MIL/uL — ABNORMAL LOW (ref 3.87–5.11)
RDW: 14.3 % (ref 11.5–15.5)
WBC: 12.8 10*3/uL — ABNORMAL HIGH (ref 4.0–10.5)

## 2013-08-17 NOTE — Progress Notes (Signed)
Patient ID: Cristina Murphy, female   DOB: April 08, 1986, 27 y.o.   MRN: [id number] #1 afebrile BP normal HGB stable.

## 2013-08-18 MED ORDER — IBUPROFEN 600 MG PO TABS: 600.0000 mg | ORAL_TABLET | Freq: Four times a day (QID) | ORAL | Status: DC

## 2013-08-18 MED ORDER — OXYCODONE-ACETAMINOPHEN 5-325 MG PO TABS: 1.0000 | ORAL_TABLET | ORAL | Status: DC | PRN

## 2013-08-18 NOTE — Progress Notes (Signed)
PPD #2 Doing ok, baby having retractions-to get CXR Afeb, VSS Fundus firm D/c home

## 2013-08-18 NOTE — Discharge Summary (Signed)
Obstetric Discharge Summary Reason for Admission: onset of labor and vaginal bleeding Prenatal Procedures: ultrasound Intrapartum Procedures: spontaneous vaginal delivery Postpartum Procedures: none Complications-Operative and Postpartum: none Hemoglobin  Date Value Range Status  08/17/2013 10.1* 12.0 - 15.0 g/dL Final  1/61/0960 45.4   Final     HCT  Date Value Range Status  08/17/2013 31.4* 36.0 - 46.0 % Final  10/25/2011 36   Final    Physical Exam:  General: alert Lochia: appropriate Uterine Fundus: firm  Discharge Diagnoses: Term Pregnancy-delivered  Discharge Information: Date: 08/18/2013 Activity: pelvic rest Diet: routine Medications: Ibuprofen and Percocet Condition: stable Instructions: refer to practice specific booklet Discharge to: home Follow-up Information   Follow up with Bing Plume, MD. Schedule an appointment as soon as possible for a visit in 6 weeks.   Specialty:  Obstetrics and Gynecology   Contact information:   [address], [address], [address] [place] [address] [phone]       Newborn Data: Live born female  Birth Weight: 6 lb 13.6 oz (3107 g) APGAR: 9, 9  Home with mother.  Wendal Wilkie D 08/18/2013, 10:02 AM

## 2013-08-21 NOTE — Progress Notes (Signed)
Post discharge chart review completed.  

## 2014-01-18 ENCOUNTER — Emergency Department (HOSPITAL_COMMUNITY)
Admission: EM | Admit: 2014-01-18 | Discharge: 2014-01-18 | Disposition: A | Discharge status: Home / Self Care | Payer: 59 | Source: Home / Self Care

## 2014-01-18 ENCOUNTER — Encounter (HOSPITAL_COMMUNITY): Payer: Self-pay | Admitting: Emergency Medicine

## 2014-01-18 DIAGNOSIS — J029 Acute pharyngitis, unspecified: Secondary | ICD-10-CM

## 2014-01-18 DIAGNOSIS — J309 Allergic rhinitis, unspecified: Secondary | ICD-10-CM

## 2014-01-18 LAB — POCT RAPID STREP A: STREPTOCOCCUS, GROUP A SCREEN (DIRECT): NEGATIVE

## 2014-01-18 NOTE — ED Provider Notes (Signed)
Medical screening examination/treatment/procedure(s) were performed by resident physician or non-physician practitioner and as supervising physician I was immediately available for consultation/collaboration.   Jensen Cheramie DOUGLAS MD.   Mylisa Brunson D Ryot Burrous, MD 01/18/14 1038 

## 2014-01-18 NOTE — Discharge Instructions (Signed)
Allergic Rhinitis Allegra 180 mg a day Robitussin DM Cepacol Loz Ibuprofen Flonase nasal spray Allergic rhinitis is when the mucous membranes in the nose respond to allergens. Allergens are particles in the air that cause your body to have an allergic reaction. This causes you to release allergic antibodies. Through a chain of events, these eventually cause you to release histamine into the blood stream. Although meant to protect the body, it is this release of histamine that causes your discomfort, such as frequent sneezing, congestion, and an itchy, runny nose.  CAUSES  Seasonal allergic rhinitis (hay fever) is caused by pollen allergens that may come from grasses, trees, and weeds. Year-round allergic rhinitis (perennial allergic rhinitis) is caused by allergens such as house dust mites, pet dander, and mold spores.  SYMPTOMS   Nasal stuffiness (congestion).  Itchy, runny nose with sneezing and tearing of the eyes. DIAGNOSIS  Your health care provider can help you determine the allergen or allergens that trigger your symptoms. If you and your health care provider are unable to determine the allergen, skin or blood testing may be used. TREATMENT  Allergic Rhinitis does not have a cure, but it can be controlled by:  Medicines and allergy shots (immunotherapy).  Avoiding the allergen. Hay fever may often be treated with antihistamines in pill or nasal spray forms. Antihistamines block the effects of histamine. There are over-the-counter medicines that may help with nasal congestion and swelling around the eyes. Check with your health care provider before taking or giving this medicine.  If avoiding the allergen or the medicine prescribed do not work, there are many new medicines your health care provider can prescribe. Stronger medicine may be used if initial measures are ineffective. Desensitizing injections can be used if medicine and avoidance does not work. Desensitization is when a  patient is given ongoing shots until the body becomes less sensitive to the allergen. Make sure you follow up with your health care provider if problems continue. HOME CARE INSTRUCTIONS It is not possible to completely avoid allergens, but you can reduce your symptoms by taking steps to limit your exposure to them. It helps to know exactly what you are allergic to so that you can avoid your specific triggers. SEEK MEDICAL CARE IF:   You have a fever.  You develop a cough that does not stop easily (persistent).  You have shortness of breath.  You start wheezing.  Symptoms interfere with normal daily activities. Document Released: 05/18/2001 Document Revised: 06/13/2013 Document Reviewed: 04/30/2013 [facility] Patient Information 2014 [place], [place].  Pharyngitis Pharyngitis is redness, pain, and swelling (inflammation) of your pharynx.  CAUSES  Pharyngitis is usually caused by infection. Most of the time, these infections are from viruses (viral) and are part of a cold. However, sometimes pharyngitis is caused by bacteria (bacterial). Pharyngitis can also be caused by allergies. Viral pharyngitis may be spread from person to person by coughing, sneezing, and personal items or utensils (cups, forks, spoons, toothbrushes). Bacterial pharyngitis may be spread from person to person by more intimate contact, such as kissing.  SIGNS AND SYMPTOMS  Symptoms of pharyngitis include:   Sore throat.   Tiredness (fatigue).   Low-grade fever.   Headache.  Joint pain and muscle aches.  Skin rashes.  Swollen lymph nodes.  Plaque-like film on throat or tonsils (often seen with bacterial pharyngitis). DIAGNOSIS  Your health care provider will ask you questions about your illness and your symptoms. Your medical history, along with a physical exam, is often all  that is needed to diagnose pharyngitis. Sometimes, a rapid strep test is done. Other lab tests may also be done, depending on the  suspected cause.  TREATMENT  Viral pharyngitis will usually get better in 3 4 days without the use of medicine. Bacterial pharyngitis is treated with medicines that kill germs (antibiotics).  HOME CARE INSTRUCTIONS   Drink enough water and fluids to keep your urine clear or pale yellow.   Only take over-the-counter or prescription medicines as directed by your health care provider:   If you are prescribed antibiotics, make sure you finish them even if you start to feel better.   Do not take aspirin.   Get lots of rest.   Gargle with 8 oz of salt water ( tsp of salt per 1 qt of water) as often as every 1 2 hours to soothe your throat.   Throat lozenges (if you are not at risk for choking) or sprays may be used to soothe your throat. SEEK MEDICAL CARE IF:   You have large, tender lumps in your neck.  You have a rash.  You cough up green, yellow-brown, or bloody spit. SEEK IMMEDIATE MEDICAL CARE IF:   Your neck becomes stiff.  You drool or are unable to swallow liquids.  You vomit or are unable to keep medicines or liquids down.  You have severe pain that does not go away with the use of recommended medicines.  You have trouble breathing (not caused by a stuffy nose). MAKE SURE YOU:   Understand these instructions.  Will watch your condition.  Will get help right away if you are not doing well or get worse. Document Released: 08/23/2005 Document Revised: 06/13/2013 Document Reviewed: 04/30/2013 [facility] Patient Information 2014 [place], [place].  Sore Throat A sore throat is a painful, burning, sore, or scratchy feeling of the throat. There may be pain or tenderness when swallowing or talking. You may have other symptoms with a sore throat. These include coughing, sneezing, fever, or a swollen neck. A sore throat is often the first sign of another sickness. These sicknesses may include a cold, flu, strep throat, or an infection called mono. Most sore throats go away  without medical treatment.  HOME CARE   Only take medicine as told by your doctor.  Drink enough fluids to keep your pee (urine) clear or pale yellow.  Rest as needed.  Try using throat sprays, lozenges, or suck on hard candy (if older than 4 years or as told).  Sip warm liquids, such as broth, herbal tea, or warm water with honey. Try sucking on frozen ice pops or drinking cold liquids.  Rinse the mouth (gargle) with salt water. Mix 1 teaspoon salt with 8 ounces of water.  Do not smoke. Avoid being around others when they are smoking.  Put a humidifier in your bedroom at night to moisten the air. You can also turn on a hot shower and sit in the bathroom for 5 10 minutes. Be sure the bathroom door is closed. GET HELP RIGHT AWAY IF:   You have trouble breathing.  You cannot swallow fluids, soft foods, or your spit (saliva).  You have more puffiness (swelling) in the throat.  Your sore throat does not get better in 7 days.  You feel sick to your stomach (nauseous) and throw up (vomit).  You have a fever or lasting symptoms for more than 2 3 days.  You have a fever and your symptoms suddenly get worse. MAKE SURE YOU:  Understand these instructions.  Will watch your condition.  Will get help right away if you are not doing well or get worse. Document Released: 06/01/2008 Document Revised: 05/17/2012 Document Reviewed: 04/30/2012 [facility] Patient Information 2014 [place], [place].

## 2014-01-18 NOTE — ED Notes (Signed)
C/o  Sore throat.  Pain with swallowing.  Neck tenderness.  Denies fever, n/v/d.  Symptoms present since  5/13.  No relief with otc meds.

## 2014-01-18 NOTE — ED Provider Notes (Signed)
CSN: [id number]     Arrival date & time 01/18/14  [id number] History   First MD Initiated Contact with Patient 01/18/14 [phone]     Chief Complaint  Patient presents with  . Sore Throat   (Consider location/radiation/quality/duration/timing/severity/associated sxs/prior Treatment) HPI Comments: C/O sorethroat and PND for 2-3 days  Patient is a 28 y.o. female presenting with pharyngitis.  Sore Throat    Past Medical History  Diagnosis Date  . No pertinent past medical history   . Medical history non-contributory   . Hypertension     Preeclampsia 2013   Past Surgical History  Procedure Laterality Date  . No past surgeries     Family History  Problem Relation Age of Onset  . Hypertension Mother   . Hypertension Father    History  Substance Use Topics  . Smoking status: Never Smoker   . Smokeless tobacco: Never Used  . Alcohol Use: No   OB History   Grav Para Term Preterm Abortions TAB SAB Ect Mult Living   2 2 2       2      Review of Systems  Constitutional: Negative for fever, chills, activity change, appetite change and fatigue.  HENT: Positive for congestion, postnasal drip, rhinorrhea and sore throat. Negative for facial swelling and sinus pressure.   Eyes: Negative.   Respiratory: Positive for cough.   Cardiovascular: Negative.   Gastrointestinal: Negative.   Musculoskeletal: Negative for neck pain and neck stiffness.  Skin: Negative for pallor and rash.  Neurological: Negative.     Allergies  Review of patient's allergies indicates no known allergies.  Home Medications   Prior to Admission medications   Medication Sig Start Date End Date Taking? Authorizing Provider  ibuprofen (ADVIL,MOTRIN) 600 MG tablet Take 1 tablet (600 mg total) by mouth every 6 (six) hours. 08/18/13   Lavina Hammanodd Meisinger, MD  oxyCODONE-acetaminophen (PERCOCET/ROXICET) 5-325 MG per tablet Take 1 tablet by mouth every 3 (three) hours as needed for moderate pain. 08/18/13   Lavina Hammanodd Meisinger, MD   Prenatal Vit-Fe Fumarate-FA (PRENATAL MULTIVITAMIN) TABS tablet Take 1 tablet by mouth daily.    Historical Provider, MD   LMP 12/19/2013  Breastfeeding? No Physical Exam  Nursing note and vitals reviewed. Constitutional: She is oriented to person, place, and time. She appears well-developed and well-nourished. No distress.  HENT:  Mouth/Throat: No oropharyngeal exudate.  Bilat TM's nl OP injected with clear PND  Eyes: Conjunctivae and EOM are normal.  Neck: Normal range of motion. Neck supple.  Cardiovascular: Normal rate, regular rhythm and normal heart sounds.   Pulmonary/Chest: Effort normal and breath sounds normal. No respiratory distress. She has no wheezes. She has no rales.  Musculoskeletal: Normal range of motion. She exhibits no edema.  Lymphadenopathy:    She has no cervical adenopathy.  Neurological: She is alert and oriented to person, place, and time.  Skin: Skin is warm and dry. No rash noted.  Psychiatric: She has a normal mood and affect.    ED Course  Procedures (including critical care time) Labs Review Labs Reviewed  POCT RAPID STREP A ([facility]RG CARE ONLY)    Imaging Review No results found.   MDM   1. Allergic rhinitis   2. Allergic pharyngitis     Allegra. Robitussin DM Cepacol, Ibuprofen Lots fluids    Hayden Rasmussenavid Loucile Posner, NP 01/18/14 [phone]

## 2014-01-20 LAB — CULTURE, GROUP A STREP

## 2014-07-08 ENCOUNTER — Encounter (HOSPITAL_COMMUNITY): Payer: Self-pay | Admitting: Emergency Medicine

## 2014-11-01 ENCOUNTER — Encounter: Payer: Self-pay | Admitting: Family Medicine

## 2014-11-01 ENCOUNTER — Ambulatory Visit (INDEPENDENT_AMBULATORY_CARE_PROVIDER_SITE_OTHER): Payer: 59 | Admitting: Family Medicine

## 2014-11-01 VITALS — BP 124/80 | HR 94 | Temp 97.9°F | Ht 66.0 in | Wt 282.1 lb

## 2014-11-01 DIAGNOSIS — Z7689 Persons encountering health services in other specified circumstances: Secondary | ICD-10-CM

## 2014-11-01 DIAGNOSIS — Z7189 Other specified counseling: Secondary | ICD-10-CM

## 2014-11-01 DIAGNOSIS — Z3040 Encounter for surveillance of contraceptives, unspecified: Secondary | ICD-10-CM

## 2014-11-01 NOTE — Progress Notes (Signed)
HPI:  Cristina Murphy is here to establish care.  Last PCP and physical: reports used to see gyn but prefers to switch to here for gyn physicals. Last pap in 2014.  Has the following chronic problems that require follow up and concerns today:   OCP: -on trinessa -doing well on this -denies: HTN, blood clots, migraine with aura -no missed doses  ROS negative for unless reported above: fevers, unintentional weight loss, hearing or vision loss, chest pain, palpitations, struggling to breath, hemoptysis, melena, hematochezia, hematuria, falls, loc, si, thoughts of self harm  Past Medical History  Diagnosis Date  . No pertinent past medical history   . Medical history non-contributory   . Hypertension     Preeclampsia 2013    Past Surgical History  Procedure Laterality Date  . No past surgeries      Family History  Problem Relation Age of Onset  . Diabetes Father   . Hypertension Father     History   Social History  . Marital Status: Married    Spouse Name: N/A  . Number of Children: N/A  . Years of Education: N/A   Social History Main Topics  . Smoking status: Never Smoker   . Smokeless tobacco: Never Used  . Alcohol Use: No  . Drug Use: No  . Sexual Activity: No     Comment: no partners currently   Other Topics Concern  . 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  TRINESSA, 28, 0.18/0.215/0.25 MG-35 MCG tablet, , Disp: , Rfl: 13  EXAM:  Filed Vitals:   11/01/14 1412  BP: 124/80  Pulse: 94  Temp: 97.9 F (36.6 C)    Body mass index is 45.55 kg/(m^2).  GENERAL: vitals reviewed and listed above, alert, oriented, appears well hydrated and in no acute distress  HEENT: atraumatic, conjunttiva clear, no obvious  abnormalities on inspection of external nose and ears  NECK: no obvious masses on inspection  LUNGS: clear to auscultation bilaterally, no wheezes, rales or rhonchi, good air movement  CV: HRRR, no peripheral edema  MS: moves all extremities without noticeable abnormality  PSYCH: pleasant and cooperative, no obvious depression or anxiety  ASSESSMENT AND PLAN:  Discussed the following assessment and plan:  Contraceptive use  Encounter to establish care  -We reviewed the PMH, PSH, FH, SH, Meds and Allergies. -We provided refills for any medications we will prescribe as needed. -We addressed current concerns per orders and patient instructions. -We have asked for records for pertinent exams, studies, vaccines and notes from previous providers. -We have advised patient to follow up per instructions below.   -Patient advised to return or notify a doctor immediately if symptoms worsen or persist or new concerns arise.  Patient Instructions  BEFORE YOU LEAVE: -schedule a CPE in 3 months, come fasting to this appointment  We recommend the following healthy lifestyle measures: - eat a healthy diet consisting of lots of vegetables, fruits, beans, nuts, seeds, healthy meats such as white chicken and fish and whole grains.  - avoid fried foods, fast food, processed foods, sodas, red meet and other fattening foods.  - get a  least 150 minutes of aerobic exercise per week.       Kriste Basque R.

## 2014-11-01 NOTE — Progress Notes (Signed)
Pre visit review using our clinic review tool, if applicable. No additional management support is needed unless otherwise documented below in the visit note. 

## 2014-11-01 NOTE — Patient Instructions (Addendum)
BEFORE YOU LEAVE: -schedule a CPE in 3 months, come fasting to this appointment  We recommend the following healthy lifestyle measures: - eat a healthy diet consisting of lots of vegetables, fruits, beans, nuts, seeds, healthy meats such as white chicken and fish and whole grains.  - avoid fried foods, fast food, processed foods, sodas, red meet and other fattening foods.  - get a least 150 minutes of aerobic exercise per week.

## 2015-01-31 ENCOUNTER — Ambulatory Visit (INDEPENDENT_AMBULATORY_CARE_PROVIDER_SITE_OTHER): Payer: 59 | Admitting: Family Medicine

## 2015-01-31 ENCOUNTER — Encounter: Payer: Self-pay | Admitting: Family Medicine

## 2015-01-31 VITALS — BP 120/84 | HR 88 | Temp 98.7°F | Wt 274.0 lb

## 2015-01-31 DIAGNOSIS — R0789 Other chest pain: Secondary | ICD-10-CM | POA: Diagnosis not present

## 2015-01-31 DIAGNOSIS — F411 Generalized anxiety disorder: Secondary | ICD-10-CM | POA: Diagnosis not present

## 2015-01-31 DIAGNOSIS — K219 Gastro-esophageal reflux disease without esophagitis: Secondary | ICD-10-CM | POA: Diagnosis not present

## 2015-01-31 NOTE — Progress Notes (Signed)
Pre visit review using our clinic review tool, if applicable. No additional management support is needed unless otherwise documented below in the visit note. 

## 2015-01-31 NOTE — Progress Notes (Signed)
HPI:  Acute visit for:  CP: -started a few weeks ago -upper L chest, dull, very brief pain last a few second, a few times per week, always occurs at rest - usually is anxious when occurs -denies: SOB, CP with activity, DOE, orthopnea, swelling -occ acid reflux with heartburn maybe 2 times per week -no new exercise or lifting -no sig FH early heart disease  GAD: -reports has been dealing with more anxiety the last 11 months, increased the last 4 months - worries a lot Symptoms of GAD: Restlessness, on edge: no Easily Fatigued: no Difficulty concentrating: sometimes Irritability: yes Muscle tension: no Sleep Disturbance: some  ROS: See pertinent positives and negatives per HPI.  Past Medical History  Diagnosis Date  . No pertinent past medical history   . Medical history non-contributory   . Hypertension     Preeclampsia 2013    Past Surgical History  Procedure Laterality Date  . No past surgeries      Family History  Problem Relation Age of Onset  . Diabetes Father   . Hypertension Father     History   Social History  . Marital Status: Married    Spouse Name: N/A  . Number of Children: N/A  . Years of Education: N/A   Social History Main Topics  . Smoking status: Never Smoker   . Smokeless tobacco: Never Used  . Alcohol Use: No  . Drug Use: No  . Sexual Activity: No     Comment: no partners currently   Other Topics Concern  . 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  TRINESSA, 28, 0.18/0.215/0.25 MG-35 MCG tablet, , Disp: , Rfl: 13  EXAM:  Filed Vitals:   01/31/15 1004  BP: 120/84  Pulse: 88  Temp: 98.7 F (37.1 C)    Body mass index is 44.25 kg/(m^2).  GENERAL: vitals reviewed and listed above, alert,  oriented, appears well hydrated and in no acute distress  HEENT: atraumatic, conjunttiva clear, no obvious abnormalities on inspection of external nose and ears  NECK: no obvious masses on inspection  LUNGS: clear to auscultation bilaterally, no wheezes, rales or rhonchi, good air movement  CV: HRRR, no peripheral edema  MS: moves all extremities without noticeable abnormality, mild TTP in L upper costal cartilage 3rd rib region - reproduces her pain  PSYCH: pleasant and cooperative, no obvious depression or anxiety  ASSESSMENT AND PLAN:  Discussed the following assessment and plan:  Atypical chest pain -we discussed possible serious and likely etiologies, workup and treatment, treatment risks and return precautions - chest wall pain most likely -after this discussion, Annella opted for ovbservation, nsaid, ppi for acid reflux -follow up advised in 2 weeks if persist and would do cxr, stress test  -of course, we advised Baneen  to return or notify a doctor immediately if symptoms worsen or persist or new concerns arise.  Gastroesophageal reflux disease, esophagitis presence not specified -low dose ppi x2 weeks  Generalized anxiety disorder -does not want to take medications and mild -advised CBT -follow up if worsens or persists  -Patient advised to return or notify a doctor immediately if symptoms worsen  or persist or new concerns arise.  There are no Patient Instructions on file for this visit.   Kriste BasqueKIM, Zubin Pontillo R.

## 2015-01-31 NOTE — Patient Instructions (Signed)
BEFORE YOU LEAVE: -schedule a physical with pap in 3 months - come fasting  Please call to set up an appointment with Dr. Jason FilaBray for help with the anxiety  Please take aleve (available over the counter) 1 tablet, 1-2 times daily for the chest pain for 1 week  Please take prilosec 20mg  once daily for 2 weeks (available over the counter  Please call in 2 weeks if the chest pain is not resolved

## 2015-05-23 ENCOUNTER — Other Ambulatory Visit (HOSPITAL_COMMUNITY)
Admission: RE | Admit: 2015-05-23 | Discharge: 2015-05-23 | Disposition: A | Discharge status: Home / Self Care | Payer: 59 | Source: Ambulatory Visit | Attending: Family Medicine | Admitting: Family Medicine

## 2015-05-23 ENCOUNTER — Ambulatory Visit (INDEPENDENT_AMBULATORY_CARE_PROVIDER_SITE_OTHER): Payer: 59 | Admitting: Family Medicine

## 2015-05-23 ENCOUNTER — Encounter: Payer: Self-pay | Admitting: Family Medicine

## 2015-05-23 VITALS — BP 120/88 | HR 72 | Temp 97.8°F | Ht 65.25 in | Wt 263.8 lb

## 2015-05-23 DIAGNOSIS — Z23 Encounter for immunization: Secondary | ICD-10-CM | POA: Diagnosis not present

## 2015-05-23 DIAGNOSIS — Z0000 Encounter for general adult medical examination without abnormal findings: Secondary | ICD-10-CM

## 2015-05-23 DIAGNOSIS — Z1151 Encounter for screening for human papillomavirus (HPV): Secondary | ICD-10-CM | POA: Diagnosis present

## 2015-05-23 DIAGNOSIS — Z01419 Encounter for gynecological examination (general) (routine) without abnormal findings: Secondary | ICD-10-CM | POA: Diagnosis present

## 2015-05-23 DIAGNOSIS — E669 Obesity, unspecified: Secondary | ICD-10-CM | POA: Diagnosis not present

## 2015-05-23 DIAGNOSIS — Z124 Encounter for screening for malignant neoplasm of cervix: Secondary | ICD-10-CM

## 2015-05-23 LAB — LIPID PANEL
Cholesterol: 144 mg/dL (ref 0–200)
HDL: 52.5 mg/dL (ref >39.00)
LDL Cholesterol: 83 mg/dL (ref 0–99)
NonHDL: 91.26
Total CHOL/HDL Ratio: 3
Triglycerides: 39 mg/dL (ref 0.0–149.0)
VLDL: 7.8 mg/dL (ref 0.0–40.0)

## 2015-05-23 LAB — HEMOGLOBIN A1C: HEMOGLOBIN A1C: 5.5 % (ref 4.6–6.5)

## 2015-05-23 NOTE — Progress Notes (Signed)
HPI:  Here for CPE:  -Concerns and/or follow up today: none  -Diet: variety of foods, balance and well rounded  -Exercise: regular exercise  -Taking folic acid, vitamin D or calcium: no  -Diabetes and Dyslipidemia Screening: FASTING for labs today  -Hx of HTN: no  -Vaccines: wants flu vaccine today  -pap history: reports normal 1-2 years ago, but hx mildly abnormal remotely  -FDLMP: see chart  -sexual activity: no  -wants STI testing (Hep C if born [birth year]): no  -Alcohol, Tobacco, drug use: see social history  Review of Systems - no fevers, unintentional weight loss, vision loss, hearing loss, chest pain, sob, hemoptysis, melena, hematochezia, hematuria, genital discharge, changing or concerning skin lesions, bleeding, bruising, loc, thoughts of self harm or SI  Past Medical History  Diagnosis Date  . No pertinent past medical history   . Medical history non-contributory   . Hypertension     Preeclampsia 2013    Past Surgical History  Procedure Laterality Date  . No past surgeries      Family History  Problem Relation Age of Onset  . Diabetes Father   . Hypertension Father     Social History   Social History  . Marital Status: Married    Spouse Name: N/A  . Number of Children: N/A  . Years of Education: N/A   Social History Main Topics  . Smoking status: Never Smoker   . Smokeless tobacco: Never Used  . Alcohol Use: No  . Drug Use: No  . Sexual Activity: No     Comment: no partners currently   Other Topics Concern  . 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  TRINESSA, 28, 0.18/0.215/0.25 MG-35 MCG tablet, , Disp: , Rfl: 13  EXAM:  Filed Vitals:   05/23/15 0936  BP: 120/88  Pulse: 72  Temp: 97.8 F  (36.6 C)    GENERAL: vitals reviewed and listed below, alert, oriented, appears well hydrated and in no acute distress  HEENT: head atraumatic, PERRLA, normal appearance of eyes, ears, nose and mouth. moist mucus membranes.  NECK: supple, no masses or lymphadenopathy  LUNGS: clear to auscultation bilaterally, no rales, rhonchi or wheeze  CV: HRRR, no peripheral edema or cyanosis, normal pedal pulses  BREAST: normal appearance - no lesions or discharge, on palpation normal breast tissue without any suspicious masses  ABDOMEN: bowel sounds normal, soft, non tender to palpation, no masses, no rebound or guarding  GU: normal appearance of external genitalia - no lesions or masses, normal vaginal mucosa - no abnormal discharge, normal appearance of cervix - no lesions or abnormal discharge, no masses or tenderness on palpation of uterus and ovaries.  RECTAL: refused  SKIN: no rash or abnormal lesions  MS: normal gait, moves all extremities normally  NEURO: CN II-XII grossly intact, normal muscle strength and sensation to light touch on extremities  PSYCH: normal affect, pleasant and cooperative  ASSESSMENT AND PLAN:  Discussed the following assessment and plan:  Visit for preventive health examination - Plan: Lipid Panel, Hemoglobin A1c  Cervical cancer screening  Obesity - Plan: Lipid Panel, Hemoglobin A1c   -Discussed and advised all  [place] preventive services health task force level A and B recommendations for age, sex and risks.  -Advised at least 150 minutes of exercise per week and a healthy diet low in saturated fats and sweets and consisting of fresh fruits and vegetables, lean meats such as fish and white chicken and whole grains.  -FASTING labs, studies and vaccines per orders this encounter  Orders Placed This Encounter  Procedures  . Lipid Panel  . Hemoglobin A1c    Patient advised to return to clinic immediately if symptoms worsen or persist or new  concerns.  Patient Instructions  BEFORE YOU LEAVE: -flu shot -labs -follow up in 1 year for physical  Vit D3 [phone] IU daily  -We have ordered labs or studies at this visit. It can take up to 1-2 weeks for results and processing. We will contact you with instructions IF your results are abnormal. Normal results will be released to your [facility]. If you have not heard from [place] or can not find your results in [facility] in 2 weeks please contact our office.  -PLEASE SIGN UP FOR MYCHART TODAY   We recommend the following healthy lifestyle measures: - eat a healthy diet consisting of small portions of vegetables, fruits, beans, nuts, seeds, healthy meats such as white chicken and fish and whole grains.  - avoid sweets, white starches,  fried foods, fast food, processed foods, sodas, red meet and other fattening foods.  - get a least 150-300 minutes of aerobic exercise per week.       No Follow-up on file.  Kriste Basque R.

## 2015-05-23 NOTE — Addendum Note (Signed)
Addended by: Johnella Moloney on: 05/23/2015 10:05 AM   Modules accepted: Orders

## 2015-05-23 NOTE — Patient Instructions (Signed)
BEFORE YOU LEAVE: -flu shot -labs -follow up in 1 year for physical  Vit D3 [phone] IU daily  -We have ordered labs or studies at this visit. It can take up to 1-2 weeks for results and processing. We will contact you with instructions IF your results are abnormal. Normal results will be released to your [facility]. If you have not heard from [place] or can not find your results in [facility] in 2 weeks please contact our office.  -PLEASE SIGN UP FOR MYCHART TODAY   We recommend the following healthy lifestyle measures: - eat a healthy diet consisting of small portions of vegetables, fruits, beans, nuts, seeds, healthy meats such as white chicken and fish and whole grains.  - avoid sweets, white starches,  fried foods, fast food, processed foods, sodas, red meet and other fattening foods.  - get a least 150-300 minutes of aerobic exercise per week.

## 2015-05-23 NOTE — Progress Notes (Signed)
Pre visit review using our clinic review tool, if applicable. No additional management support is needed unless otherwise documented below in the visit note. 

## 2015-05-26 LAB — CYTOLOGY - PAP

## 2015-05-28 ENCOUNTER — Telehealth: Payer: Self-pay | Admitting: Family Medicine

## 2015-05-28 NOTE — Telephone Encounter (Signed)
Pt states she had her pap here, now her obgyn will not fill her BC pills.  Would like to transfer this rx to you. Pt currently taking Trinessa tab 28s   [facility]/ [address]

## 2015-05-29 MED ORDER — TRINESSA (28) 0.18/0.215/0.25 MG-35 MCG PO TABS: 1.0000 | ORAL_TABLET | Freq: Every day | ORAL | Status: DC

## 2015-05-29 NOTE — Telephone Encounter (Signed)
done

## 2015-05-29 NOTE — Telephone Encounter (Signed)
Noted  

## 2016-03-19 DIAGNOSIS — Z7689 Persons encountering health services in other specified circumstances: Secondary | ICD-10-CM

## 2016-03-24 ENCOUNTER — Telehealth: Payer: Self-pay | Admitting: Family Medicine

## 2016-03-24 NOTE — Telephone Encounter (Signed)
Pt returned your call about a form that you had called her about.  Pt state that there was not a 3rd page to the form and would like to talk more with you about it.  Pt will call back tomorrow.

## 2016-03-25 NOTE — Telephone Encounter (Signed)
Patient states she will call the school regarding the form and call back.

## 2016-04-09 NOTE — Telephone Encounter (Signed)
Nelva Bush showed me the form in the folder up front which states the paperwork was mailed to the pt on 8/2.  I called the pt and informed her of this and she agreed.

## 2016-04-09 NOTE — Telephone Encounter (Signed)
Pt states class starts in 2 weeks and they will not let her in without the paperwork. Pt states you should have allo you need to fill out for her.

## 2016-06-17 ENCOUNTER — Other Ambulatory Visit: Payer: Self-pay | Admitting: Family Medicine

## 2016-06-17 NOTE — Progress Notes (Signed)
HPI:  Here for CPE:  -Concerns and/or follow up today:   Allergies: -sneezing, nasal congestion, PND, itchy eyes, sinus headaches -chronic -has used zyrtec in the past that was helpful -also sometimes feels far vision is not as good  -Diet: variety of foods, balance and well rounded - has lost 74 lbs in 1 year!!!! -Exercise:regular exercise - working with Systems analystpersonal trainer  -Taking folic acid, vitamin D or calcium: no  -Diabetes and Dyslipidemia Screening: done last year and normal  -Hx of HTN: no  -Vaccines: UTD -did flu shot at work per her report  -pap history: 05/2015, normal and hpv negative; hx ? Remote abnormal pap with reported neg biopsy, but all normal since.  -FDLMP: on period now  -sexual activity: yes, female partner, no new partners  -wants STI testing (Hep C if born [birth year]): no  -FH breast, colon or ovarian ca: see FH Last mammogram: n/a Last colon cancer screening: n/a  Breast Ca Risk Assessment: -no sig fh or personal hx  -Alcohol, Tobacco, drug use: see social history  Review of Systems - no fevers, unintentional weight loss, vision loss, hearing loss, chest pain, sob, hemoptysis, melena, hematochezia, hematuria, genital discharge, changing or concerning skin lesions, bleeding, bruising, loc, thoughts of self harm or SI  Past Medical History:  Diagnosis Date  . Hypertension    Preeclampsia 2013  . Medical history non-contributory   . No pertinent past medical history     Past Surgical History:  Procedure Laterality Date  . NO PAST SURGERIES      Family History  Problem Relation Age of Onset  . Diabetes Father   . Hypertension Father     Social History   Social History  . Marital status: Married    Spouse name: N/A  . Number of children: N/A  . Years of education: N/A   Social History Main Topics  . Smoking status: Never Smoker  . Smokeless tobacco: Never Used  . Alcohol use No  . Drug use: No  . Sexual activity: No      Comment: no partners currently   Other Topics Concern  . 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           Current Outpatient Prescriptions:  .  TRINESSA, 28, 0.18/0.215/0.25 MG-35 MCG tablet, Take 1 tablet by mouth daily., Disp: 1 Package, Rfl: 11  EXAM:  Vitals:   06/18/16 0927  BP: 126/80  Pulse: 69  Temp: 97.9 F (36.6 C)    GENERAL: vitals reviewed and listed below, alert, oriented, appears well hydrated and in no acute distress  HEENT: head atraumatic, PERRLA, normal appearance of eyes, ears, nose and mouth. moist mucus membranes.  NECK: supple, no masses or lymphadenopathy  LUNGS: clear to auscultation bilaterally, no rales, rhonchi or wheeze  CV: HRRR, no peripheral edema or cyanosis, normal pedal pulses  BREAST: normal appearance - no lesions or discharge, on palpation normal breast tissue without any suspicious masses  ABDOMEN: bowel sounds normal, soft, non tender to palpation, no masses, no rebound or guarding  GU: declined  SKIN: no rash or abnormal lesions  MS: normal gait, moves all extremities normally  NEURO: normal gait, speech and thought processing grossly  intact, muscle tone grossly intact throughout  PSYCH: normal affect, pleasant and cooperative  ASSESSMENT AND PLAN:  Discussed the following assessment and plan: Encounter for preventive health examination  Seasonal allergic rhinitis, unspecified chronicity, unspecified trigger  Poor vision  -Discussed and advised all [place] preventive services health task force level A and B recommendations for age, sex and risks.  -Advised at least 150 minutes of exercise per week and a healthy diet - congratulated on remarkable change and success! She does want to be on our wall of  fame.  -studies and vaccines per orders this encounter  -advised eye exam and allergy treatments - she is to follow up if any persistent concerns  No orders of the defined types were placed in this encounter.   Patient advised to return to clinic immediately if symptoms worsen or persist or new concerns.  Patient Instructions  BEFORE YOU LEAVE: -follow up: as needed and yearly  CONGRATULATIONS on a healthy lifestyle!  Please schedule an eye exam.  Can try flonase and an antihistamine for the allergies. If headaches do not improve please schedule an appointment.    No Follow-up on file.  Kriste Basque R., DO

## 2016-06-18 ENCOUNTER — Encounter: Payer: Self-pay | Admitting: Family Medicine

## 2016-06-18 ENCOUNTER — Ambulatory Visit (INDEPENDENT_AMBULATORY_CARE_PROVIDER_SITE_OTHER): Payer: 59 | Admitting: Family Medicine

## 2016-06-18 VITALS — BP 126/80 | HR 69 | Temp 97.9°F | Ht 65.0 in | Wt 196.4 lb

## 2016-06-18 DIAGNOSIS — H547 Unspecified visual loss: Secondary | ICD-10-CM

## 2016-06-18 DIAGNOSIS — Z0000 Encounter for general adult medical examination without abnormal findings: Secondary | ICD-10-CM

## 2016-06-18 DIAGNOSIS — J302 Other seasonal allergic rhinitis: Secondary | ICD-10-CM | POA: Diagnosis not present

## 2016-06-18 MED ORDER — TRINESSA (28) 0.18/0.215/0.25 MG-35 MCG PO TABS: 1.0000 | ORAL_TABLET | Freq: Every day | ORAL | 11 refills | Status: DC

## 2016-06-18 NOTE — Progress Notes (Signed)
Pre visit review using our clinic review tool, if applicable. No additional management support is needed unless otherwise documented below in the visit note. 

## 2016-06-18 NOTE — Patient Instructions (Signed)
BEFORE YOU LEAVE: -follow up: as needed and yearly  CONGRATULATIONS on a healthy lifestyle!  Please schedule an eye exam.  Can try flonase and an antihistamine for the allergies. If headaches do not improve please schedule an appointment.

## 2017-05-26 ENCOUNTER — Encounter: Payer: Self-pay | Admitting: Family Medicine

## 2017-06-22 NOTE — Progress Notes (Signed)
HPI:   Here for CPE: due for labs and flu shot -Concerns and/or follow up today:   None  -Diet: variety of foods, balance and well rounded - has lost 74 lbs in 217 with wt 196 06/2016 --> now down even more to 182.5!  -Exercise:regular exercise   -Taking folic acid, vitamin D or calcium: no  -Diabetes and Dyslipidemia Screening: fasting for labs  -Hx of HTN: no  -Vaccines: UTD, had flu shot eslewhere  -pap history: 05/2015, normal and hpv negative; hx ? Remote abnormal pap with reported neg biopsy, but all normal since.  -FDLMP: on period now  -sexual activity: yes, female partner, no new partners  -wants STI testing (Hep C if born [birth year]): no  -FH breast, colon or ovarian ca: see FH Last mammogram: n/a Last colon cancer screening: n/a  Breast Ca Risk Assessment: -no sig fh or personal hx  -Alcohol, Tobacco, drug use: see social history  Review of Systems - no fevers, unintentional weight loss, vision loss, hearing loss, chest pain, sob, hemoptysis, melena, hematochezia, hematuria, genital discharge, changing or concerning skin lesions, bleeding, bruising, loc, thoughts of self harm or SI  Past Medical History:  Diagnosis Date  . Hypertension    Preeclampsia 2013  . Medical history non-contributory   . No pertinent past medical history     Past Surgical History:  Procedure Laterality Date  . NO PAST SURGERIES      Family History  Problem Relation Age of Onset  . Diabetes Father   . Hypertension Father     Social History   Social History  . Marital status: Married    Spouse name: N/A  . Number of children: N/A  . Years of education: N/A   Social History Main Topics  . Smoking status: Never Smoker  . Smokeless tobacco: Never Used  . Alcohol use No  . Drug use: No  . Sexual activity: No     Comment: no partners currently   Other Topics Concern  . None   Social History Narrative   Work or School: Therapist, sports - orthopedics at [employer] Situation: lives with two children age 26 and 1 month      Spiritual Beliefs: none      Lifestyle: regular exercise, healthy diet chang ein 2017 --> lost 74lbs!           Current Outpatient Prescriptions:  .  TRINESSA, 29, 0.18/0.215/0.25 MG-35 MCG tablet, Take 1 tablet by mouth daily. (Patient not taking: Reported on 06/24/2017), Disp: 1 Package, Rfl: 11  EXAM:  Vitals:   06/24/17 0809  BP: 100/60  Pulse: (!) 51  Temp: 97.7 F (36.5 C)    GENERAL: vitals reviewed and listed below, alert, oriented, appears well hydrated and in no acute distress  HEENT: head atraumatic, PERRLA, normal appearance of eyes, ears, nose and mouth. moist mucus membranes.  NECK: supple, no masses or lymphadenopathy  LUNGS: clear to auscultation bilaterally, no rales, rhonchi or wheeze  CV: HRRR, no peripheral edema or cyanosis, normal pedal pulses  ABDOMEN: bowel sounds normal, soft, non tender to palpation, no masses, no rebound or guarding  BREAST: normal appearance - no skin lesions or discharge noted on inspection of both breasts, on palpation of both breast and axillary region no suspicious lesions appreciated today  GU: declined  RECTAL: deferred  SKIN: no rash or abnormal lesions  MS: normal gait, moves all extremities normally  NEURO: normal gait, speech and thought processing  grossly intact, muscle tone grossly intact throughout  PSYCH: normal affect, pleasant and cooperative  ASSESSMENT AND PLAN:  Discussed the following assessment and plan:  Encounter for preventative adult health care examination - Plan: Lipid panel, Hemoglobin A1c   -Discussed and advised all [place] preventive services health task force level A and B recommendations for age, sex and risks.  -Advised at least 150 minutes of exercise per week and a healthy diet with avoidance of (less then 1 serving per week) processed foods, white starches, red meat, fast foods and sweets and consisting of: * 5-9  servings of fresh fruits and vegetables (not corn or potatoes) *nuts and seeds, beans *olives and olive oil *lean meats such as fish and white chicken  *whole grains  -labs, studies and vaccines per orders this encounter  Orders Placed This Encounter  Procedures  . Lipid panel  . Hemoglobin A1c    Patient advised to return to clinic immediately if symptoms worsen or persist or new concerns.  Patient Instructions  BEFORE YOU LEAVE: -follow up: yearly for physical -labs  We have ordered labs or studies at this visit. It can take up to 1-2 weeks for results and processing. IF results require follow up or explanation, we will call you with instructions. Clinically stable results will be released to your [facility]. If you have not heard from [place] or cannot find your results in [facility] in 2 weeks please contact our office at [phone].  If you are not yet signed up for [facility], please consider signing up.  WE NOW OFFER   [facility] FAST TRACK!!!  SAME DAY Appointments for ACUTE CARE  Such as: Sprains, Injuries, cuts, abrasions, rashes, muscle pain, joint pain, back pain Colds, flu, sore throats, headache, allergies, cough, fever  Ear pain, sinus and eye infections Abdominal pain, nausea, vomiting, diarrhea, upset stomach Animal/insect bites  3 Easy Ways to Schedule: Walk-In Scheduling Call in scheduling Mychart Sign-up: [email or web address]   Health Maintenance, Female Adopting a healthy lifestyle and getting preventive care can go a long way to promote health and wellness. Talk with your health care provider about what schedule of regular examinations is right for you. This is a good chance for you to check in with your provider about disease prevention and staying healthy. In between checkups, there are plenty of things you can do on your own. Experts have done a lot of research about which lifestyle changes and preventive measures are most likely to keep  you healthy. Ask your health care provider for more information. Weight and diet Eat a healthy diet  Be sure to include plenty of vegetables, fruits, low-fat dairy products, and lean protein.  Do not eat a lot of foods high in solid fats, added sugars, or salt.  Get regular exercise. This is one of the most important things you can do for your health. ? Most adults should exercise for at least 150 minutes each week. The exercise should increase your heart rate and make you sweat (moderate-intensity exercise). ? Most adults should also do strengthening exercises at least twice a week. This is in addition to the moderate-intensity exercise.  Maintain a healthy weight  Body mass index (BMI) is a measurement that can be used to identify possible weight problems. It estimates body fat based on height and weight. Your health care provider can help determine your BMI and help you achieve or maintain a healthy weight.  For females 14 years of age and older: ? A BMI below 18.5  is considered underweight. ? A BMI of 18.5 to 24.9 is normal. ? A BMI of 25 to 29.9 is considered overweight. ? A BMI of 30 and above is considered obese.  Watch levels of cholesterol and blood lipids  You should start having your blood tested for lipids and cholesterol at 30 years of age, then have this test every 5 years.  You may need to have your cholesterol levels checked more often if: ? Your lipid or cholesterol levels are high. ? You are older than 30 years of age. ? You are at high risk for heart disease.  Cancer screening Lung Cancer  Lung cancer screening is recommended for adults 81-61 years old who are at high risk for lung cancer because of a history of smoking.  A yearly low-dose CT scan of the lungs is recommended for people who: ? Currently smoke. ? Have quit within the past 15 years. ? Have at least a 30-pack-year history of smoking. A pack year is smoking an average of one pack of cigarettes a  day for 1 year.  Yearly screening should continue until it has been 15 years since you quit.  Yearly screening should stop if you develop a health problem that would prevent you from having lung cancer treatment.  Breast Cancer  Practice breast self-awareness. This means understanding how your breasts normally appear and feel.  It also means doing regular breast self-exams. Let your health care provider know about any changes, no matter how small.  If you are in your 20s or 30s, you should have a clinical breast exam (CBE) by a health care provider every 1-3 years as part of a regular health exam.  If you are 59 or older, have a CBE every year. Also consider having a breast X-ray (mammogram) every year.  If you have a family history of breast cancer, talk to your health care provider about genetic screening.  If you are at high risk for breast cancer, talk to your health care provider about having an MRI and a mammogram every year.  Breast cancer gene (BRCA) assessment is recommended for women who have family members with BRCA-related cancers. BRCA-related cancers include: ? Breast. ? Ovarian. ? Tubal. ? Peritoneal cancers.  Results of the assessment will determine the need for genetic counseling and BRCA1 and BRCA2 testing.  Cervical Cancer Your health care provider may recommend that you be screened regularly for cancer of the pelvic organs (ovaries, uterus, and vagina). This screening involves a pelvic examination, including checking for microscopic changes to the surface of your cervix (Pap test). You may be encouraged to have this screening done every 3 years, beginning at age 11.  For women ages 59-65, health care providers may recommend pelvic exams and Pap testing every 3 years, or they may recommend the Pap and pelvic exam, combined with testing for human papilloma virus (HPV), every 5 years. Some types of HPV increase your risk of cervical cancer. Testing for HPV may also be  done on women of any age with unclear Pap test results.  Other health care providers may not recommend any screening for nonpregnant women who are considered low risk for pelvic cancer and who do not have symptoms. Ask your health care provider if a screening pelvic exam is right for you.  If you have had past treatment for cervical cancer or a condition that could lead to cancer, you need Pap tests and screening for cancer for at least 20 years after your treatment.  If Pap tests have been discontinued, your risk factors (such as having a new sexual partner) need to be reassessed to determine if screening should resume. Some women have medical problems that increase the chance of getting cervical cancer. In these cases, your health care provider may recommend more frequent screening and Pap tests.  Colorectal Cancer  This type of cancer can be detected and often prevented.  Routine colorectal cancer screening usually begins at 30 years of age and continues through 30 years of age.  Your health care provider may recommend screening at an earlier age if you have risk factors for colon cancer.  Your health care provider may also recommend using home test kits to check for hidden blood in the stool.  A small camera at the end of a tube can be used to examine your colon directly (sigmoidoscopy or colonoscopy). This is done to check for the earliest forms of colorectal cancer.  Routine screening usually begins at age 24.  Direct examination of the colon should be repeated every 5-10 years through 30 years of age. However, you may need to be screened more often if early forms of precancerous polyps or small growths are found.  Skin Cancer  Check your skin from head to toe regularly.  Tell your health care provider about any new moles or changes in moles, especially if there is a change in a mole's shape or color.  Also tell your health care provider if you have a mole that is larger than the  size of a pencil eraser.  Always use sunscreen. Apply sunscreen liberally and repeatedly throughout the day.  Protect yourself by wearing long sleeves, pants, a wide-brimmed hat, and sunglasses whenever you are outside.  Heart disease, diabetes, and high blood pressure  High blood pressure causes heart disease and increases the risk of stroke. High blood pressure is more likely to develop in: ? People who have blood pressure in the high end of the normal range (130-139/85-89 mm Hg). ? People who are overweight or obese. ? People who are African American.  If you are 75-28 years of age, have your blood pressure checked every 3-5 years. If you are 37 years of age or older, have your blood pressure checked every year. You should have your blood pressure measured twice-once when you are at a hospital or clinic, and once when you are not at a hospital or clinic. Record the average of the two measurements. To check your blood pressure when you are not at a hospital or clinic, you can use: ? An automated blood pressure machine at a pharmacy. ? A home blood pressure monitor.  If you are between 64 years and 10 years old, ask your health care provider if you should take aspirin to prevent strokes.  Have regular diabetes screenings. This involves taking a blood sample to check your fasting blood sugar level. ? If you are at a normal weight and have a low risk for diabetes, have this test once every three years after 30 years of age. ? If you are overweight and have a high risk for diabetes, consider being tested at a younger age or more often. Preventing infection Hepatitis B  If you have a higher risk for hepatitis B, you should be screened for this virus. You are considered at high risk for hepatitis B if: ? You were born in a country where hepatitis B is common. Ask your health care provider which countries are considered high risk. ? Your  parents were born in a high-risk country, and you have  not been immunized against hepatitis B (hepatitis B vaccine). ? You have HIV or AIDS. ? You use needles to inject street drugs. ? You live with someone who has hepatitis B. ? You have had sex with someone who has hepatitis B. ? You get hemodialysis treatment. ? You take certain medicines for conditions, including cancer, organ transplantation, and autoimmune conditions.  Hepatitis C  Blood testing is recommended for: ? Everyone born from [birth year] through 1964. ? Anyone with known risk factors for hepatitis C.  Sexually transmitted infections (STIs)  You should be screened for sexually transmitted infections (STIs) including gonorrhea and chlamydia if: ? You are sexually active and are younger than 30 years of age. ? You are older than 30 years of age and your health care provider tells you that you are at risk for this type of infection. ? Your sexual activity has changed since you were last screened and you are at an increased risk for chlamydia or gonorrhea. Ask your health care provider if you are at risk.  If you do not have HIV, but are at risk, it may be recommended that you take a prescription medicine daily to prevent HIV infection. This is called pre-exposure prophylaxis (PrEP). You are considered at risk if: ? You are sexually active and do not regularly use condoms or know the HIV status of your partner(s). ? You take drugs by injection. ? You are sexually active with a partner who has HIV.  Talk with your health care provider about whether you are at high risk of being infected with HIV. If you choose to begin PrEP, you should first be tested for HIV. You should then be tested every 3 months for as long as you are taking PrEP. Pregnancy  If you are premenopausal and you may become pregnant, ask your health care provider about preconception counseling.  If you may become pregnant, take 400 to 800 micrograms (mcg) of folic acid every day.  If you want to prevent pregnancy, talk  to your health care provider about birth control (contraception). Osteoporosis and menopause  Osteoporosis is a disease in which the bones lose minerals and strength with aging. This can result in serious bone fractures. Your risk for osteoporosis can be identified using a bone density scan.  If you are 57 years of age or older, or if you are at risk for osteoporosis and fractures, ask your health care provider if you should be screened.  Ask your health care provider whether you should take a calcium or vitamin D supplement to lower your risk for osteoporosis.  Menopause may have certain physical symptoms and risks.  Hormone replacement therapy may reduce some of these symptoms and risks. Talk to your health care provider about whether hormone replacement therapy is right for you. Follow these instructions at home:  Schedule regular health, dental, and eye exams.  Stay current with your immunizations.  Do not use any tobacco products including cigarettes, chewing tobacco, or electronic cigarettes.  If you are pregnant, do not drink alcohol.  If you are breastfeeding, limit how much and how often you drink alcohol.  Limit alcohol intake to no more than 1 drink per day for nonpregnant women. One drink equals 12 ounces of beer, 5 ounces of wine, or 1 ounces of hard liquor.  Do not use street drugs.  Do not share needles.  Ask your health care provider for help if you need  support or information about quitting drugs.  Tell your health care provider if you often feel depressed.  Tell your health care provider if you have ever been abused or do not feel safe at home. This information is not intended to replace advice given to you by your health care provider. Make sure you discuss any questions you have with your health care provider. Document Released: 03/08/2011 Document Revised: 01/29/2016 Document Reviewed: 05/27/2015 Elsevier Interactive Patient Education  2018 [employer].                No Follow-up on file.  Colin Benton R., DO

## 2017-06-24 ENCOUNTER — Ambulatory Visit (INDEPENDENT_AMBULATORY_CARE_PROVIDER_SITE_OTHER): Payer: 59 | Admitting: Family Medicine

## 2017-06-24 ENCOUNTER — Encounter: Payer: Self-pay | Admitting: Family Medicine

## 2017-06-24 VITALS — BP 100/60 | HR 51 | Temp 97.7°F | Ht 64.75 in | Wt 182.5 lb

## 2017-06-24 DIAGNOSIS — Z0000 Encounter for general adult medical examination without abnormal findings: Secondary | ICD-10-CM | POA: Diagnosis not present

## 2017-06-24 DIAGNOSIS — Z1331 Encounter for screening for depression: Secondary | ICD-10-CM

## 2017-06-24 LAB — LIPID PANEL
Cholesterol: 121 mg/dL (ref 0–200)
HDL: 49 mg/dL (ref >39.00)
LDL Cholesterol: 67 mg/dL (ref 0–99)
NonHDL: 71.71
Total CHOL/HDL Ratio: 2
Triglycerides: 26 mg/dL (ref 0.0–149.0)
VLDL: 5.2 mg/dL (ref 0.0–40.0)

## 2017-06-24 LAB — HEMOGLOBIN A1C: Hgb A1c MFr Bld: 5.2 % (ref 4.6–6.5)

## 2017-06-24 NOTE — Patient Instructions (Signed)
BEFORE YOU LEAVE: -follow up: yearly for physical -labs  We have ordered labs or studies at this visit. It can take up to 1-2 weeks for results and processing. IF results require follow up or explanation, we will call you with instructions. Clinically stable results will be released to your [facility]. If you have not heard from [place] or cannot find your results in [facility] in 2 weeks please contact our office at [phone].  If you are not yet signed up for [facility], please consider signing up.  WE NOW OFFER   [facility] FAST TRACK!!!  SAME DAY Appointments for ACUTE CARE  Such as: Sprains, Injuries, cuts, abrasions, rashes, muscle pain, joint pain, back pain Colds, flu, sore throats, headache, allergies, cough, fever  Ear pain, sinus and eye infections Abdominal pain, nausea, vomiting, diarrhea, upset stomach Animal/insect bites  3 Easy Ways to Schedule: Walk-In Scheduling Call in scheduling Mychart Sign-up: [email or web address]   Health Maintenance, Female Adopting a healthy lifestyle and getting preventive care can go a long way to promote health and wellness. Talk with your health care provider about what schedule of regular examinations is right for you. This is a good chance for you to check in with your provider about disease prevention and staying healthy. In between checkups, there are plenty of things you can do on your own. Experts have done a lot of research about which lifestyle changes and preventive measures are most likely to keep you healthy. Ask your health care provider for more information. Weight and diet Eat a healthy diet  Be sure to include plenty of vegetables, fruits, low-fat dairy products, and lean protein.  Do not eat a lot of foods high in solid fats, added sugars, or salt.  Get regular exercise. This is one of the most important things you can do for your health. ? Most adults should exercise for at least 150 minutes each week. The  exercise should increase your heart rate and make you sweat (moderate-intensity exercise). ? Most adults should also do strengthening exercises at least twice a week. This is in addition to the moderate-intensity exercise.  Maintain a healthy weight  Body mass index (BMI) is a measurement that can be used to identify possible weight problems. It estimates body fat based on height and weight. Your health care provider can help determine your BMI and help you achieve or maintain a healthy weight.  For females 23 years of age and older: ? A BMI below 18.5 is considered underweight. ? A BMI of 18.5 to 24.9 is normal. ? A BMI of 25 to 29.9 is considered overweight. ? A BMI of 30 and above is considered obese.  Watch levels of cholesterol and blood lipids  You should start having your blood tested for lipids and cholesterol at 30 years of age, then have this test every 5 years.  You may need to have your cholesterol levels checked more often if: ? Your lipid or cholesterol levels are high. ? You are older than 30 years of age. ? You are at high risk for heart disease.  Cancer screening Lung Cancer  Lung cancer screening is recommended for adults 50-10 years old who are at high risk for lung cancer because of a history of smoking.  A yearly low-dose CT scan of the lungs is recommended for people who: ? Currently smoke. ? Have quit within the past 15 years. ? Have at least a 30-pack-year history of smoking. A pack year is smoking an average of  one pack of cigarettes a day for 1 year.  Yearly screening should continue until it has been 15 years since you quit.  Yearly screening should stop if you develop a health problem that would prevent you from having lung cancer treatment.  Breast Cancer  Practice breast self-awareness. This means understanding how your breasts normally appear and feel.  It also means doing regular breast self-exams. Let your health care provider know about any  changes, no matter how small.  If you are in your 20s or 30s, you should have a clinical breast exam (CBE) by a health care provider every 1-3 years as part of a regular health exam.  If you are 80 or older, have a CBE every year. Also consider having a breast X-ray (mammogram) every year.  If you have a family history of breast cancer, talk to your health care provider about genetic screening.  If you are at high risk for breast cancer, talk to your health care provider about having an MRI and a mammogram every year.  Breast cancer gene (BRCA) assessment is recommended for women who have family members with BRCA-related cancers. BRCA-related cancers include: ? Breast. ? Ovarian. ? Tubal. ? Peritoneal cancers.  Results of the assessment will determine the need for genetic counseling and BRCA1 and BRCA2 testing.  Cervical Cancer Your health care provider may recommend that you be screened regularly for cancer of the pelvic organs (ovaries, uterus, and vagina). This screening involves a pelvic examination, including checking for microscopic changes to the surface of your cervix (Pap test). You may be encouraged to have this screening done every 3 years, beginning at age 77.  For women ages 82-65, health care providers may recommend pelvic exams and Pap testing every 3 years, or they may recommend the Pap and pelvic exam, combined with testing for human papilloma virus (HPV), every 5 years. Some types of HPV increase your risk of cervical cancer. Testing for HPV may also be done on women of any age with unclear Pap test results.  Other health care providers may not recommend any screening for nonpregnant women who are considered low risk for pelvic cancer and who do not have symptoms. Ask your health care provider if a screening pelvic exam is right for you.  If you have had past treatment for cervical cancer or a condition that could lead to cancer, you need Pap tests and screening for cancer  for at least 20 years after your treatment. If Pap tests have been discontinued, your risk factors (such as having a new sexual partner) need to be reassessed to determine if screening should resume. Some women have medical problems that increase the chance of getting cervical cancer. In these cases, your health care provider may recommend more frequent screening and Pap tests.  Colorectal Cancer  This type of cancer can be detected and often prevented.  Routine colorectal cancer screening usually begins at 30 years of age and continues through 30 years of age.  Your health care provider may recommend screening at an earlier age if you have risk factors for colon cancer.  Your health care provider may also recommend using home test kits to check for hidden blood in the stool.  A small camera at the end of a tube can be used to examine your colon directly (sigmoidoscopy or colonoscopy). This is done to check for the earliest forms of colorectal cancer.  Routine screening usually begins at age 50.  Direct examination of the colon should  be repeated every 5-10 years through 30 years of age. However, you may need to be screened more often if early forms of precancerous polyps or small growths are found.  Skin Cancer  Check your skin from head to toe regularly.  Tell your health care provider about any new moles or changes in moles, especially if there is a change in a mole's shape or color.  Also tell your health care provider if you have a mole that is larger than the size of a pencil eraser.  Always use sunscreen. Apply sunscreen liberally and repeatedly throughout the day.  Protect yourself by wearing long sleeves, pants, a wide-brimmed hat, and sunglasses whenever you are outside.  Heart disease, diabetes, and high blood pressure  High blood pressure causes heart disease and increases the risk of stroke. High blood pressure is more likely to develop in: ? People who have blood  pressure in the high end of the normal range (130-139/85-89 mm Hg). ? People who are overweight or obese. ? People who are African American.  If you are 31-42 years of age, have your blood pressure checked every 3-5 years. If you are 72 years of age or older, have your blood pressure checked every year. You should have your blood pressure measured twice-once when you are at a hospital or clinic, and once when you are not at a hospital or clinic. Record the average of the two measurements. To check your blood pressure when you are not at a hospital or clinic, you can use: ? An automated blood pressure machine at a pharmacy. ? A home blood pressure monitor.  If you are between 53 years and 25 years old, ask your health care provider if you should take aspirin to prevent strokes.  Have regular diabetes screenings. This involves taking a blood sample to check your fasting blood sugar level. ? If you are at a normal weight and have a low risk for diabetes, have this test once every three years after 30 years of age. ? If you are overweight and have a high risk for diabetes, consider being tested at a younger age or more often. Preventing infection Hepatitis B  If you have a higher risk for hepatitis B, you should be screened for this virus. You are considered at high risk for hepatitis B if: ? You were born in a country where hepatitis B is common. Ask your health care provider which countries are considered high risk. ? Your parents were born in a high-risk country, and you have not been immunized against hepatitis B (hepatitis B vaccine). ? You have HIV or AIDS. ? You use needles to inject street drugs. ? You live with someone who has hepatitis B. ? You have had sex with someone who has hepatitis B. ? You get hemodialysis treatment. ? You take certain medicines for conditions, including cancer, organ transplantation, and autoimmune conditions.  Hepatitis C  Blood testing is recommended  for: ? Everyone born from [birth year] through 1964. ? Anyone with known risk factors for hepatitis C.  Sexually transmitted infections (STIs)  You should be screened for sexually transmitted infections (STIs) including gonorrhea and chlamydia if: ? You are sexually active and are younger than 30 years of age. ? You are older than 30 years of age and your health care provider tells you that you are at risk for this type of infection. ? Your sexual activity has changed since you were last screened and you are at an increased risk  for chlamydia or gonorrhea. Ask your health care provider if you are at risk.  If you do not have HIV, but are at risk, it may be recommended that you take a prescription medicine daily to prevent HIV infection. This is called pre-exposure prophylaxis (PrEP). You are considered at risk if: ? You are sexually active and do not regularly use condoms or know the HIV status of your partner(s). ? You take drugs by injection. ? You are sexually active with a partner who has HIV.  Talk with your health care provider about whether you are at high risk of being infected with HIV. If you choose to begin PrEP, you should first be tested for HIV. You should then be tested every 3 months for as long as you are taking PrEP. Pregnancy  If you are premenopausal and you may become pregnant, ask your health care provider about preconception counseling.  If you may become pregnant, take 400 to 800 micrograms (mcg) of folic acid every day.  If you want to prevent pregnancy, talk to your health care provider about birth control (contraception). Osteoporosis and menopause  Osteoporosis is a disease in which the bones lose minerals and strength with aging. This can result in serious bone fractures. Your risk for osteoporosis can be identified using a bone density scan.  If you are 44 years of age or older, or if you are at risk for osteoporosis and fractures, ask your health care provider if  you should be screened.  Ask your health care provider whether you should take a calcium or vitamin D supplement to lower your risk for osteoporosis.  Menopause may have certain physical symptoms and risks.  Hormone replacement therapy may reduce some of these symptoms and risks. Talk to your health care provider about whether hormone replacement therapy is right for you. Follow these instructions at home:  Schedule regular health, dental, and eye exams.  Stay current with your immunizations.  Do not use any tobacco products including cigarettes, chewing tobacco, or electronic cigarettes.  If you are pregnant, do not drink alcohol.  If you are breastfeeding, limit how much and how often you drink alcohol.  Limit alcohol intake to no more than 1 drink per day for nonpregnant women. One drink equals 12 ounces of beer, 5 ounces of wine, or 1 ounces of hard liquor.  Do not use street drugs.  Do not share needles.  Ask your health care provider for help if you need support or information about quitting drugs.  Tell your health care provider if you often feel depressed.  Tell your health care provider if you have ever been abused or do not feel safe at home. This information is not intended to replace advice given to you by your health care provider. Make sure you discuss any questions you have with your health care provider. Document Released: 03/08/2011 Document Revised: 01/29/2016 Document Reviewed: 05/27/2015 Elsevier Interactive Patient Education  [employer].

## 2017-07-11 ENCOUNTER — Telehealth: Payer: Self-pay | Admitting: *Deleted

## 2017-07-11 NOTE — Telephone Encounter (Signed)
Dr Selena BattenKim looked at the form and stated we need a copy of the pts immunizations to complete the form.  I left a message for the pt to return my call.

## 2017-07-11 NOTE — Telephone Encounter (Signed)
Form received today and placed in Dr Elmyra RicksKim's folder.

## 2017-07-11 NOTE — Telephone Encounter (Signed)
    Copied from CRM #[id number]. Topic: General - Other >> Jul 08, 2017  1:56 PM Suggs, Tammy E wrote: Reason for CRM: Patient dropped off [facility] Immunizations and Physical Forms to be filled out.

## 2017-07-12 NOTE — Telephone Encounter (Signed)
Pt's immunizations are on [facility] under her maiden name. [facility]

## 2017-07-12 NOTE — Telephone Encounter (Signed)
Randa EvensJoanne pt returned your call and said that [facility] told the pt that when she dropped it off she was able to see all of the immunization and if anything else is needed they will let her know.

## 2017-07-13 NOTE — Telephone Encounter (Signed)
Form completed yesterday and returned to Ronnald CollumJo anne to fax.

## 2017-07-14 NOTE — Telephone Encounter (Signed)
I left a detailed message on 11/6 the form was completed and left at the front desk for the pt to pick up.

## 2017-09-15 ENCOUNTER — Encounter: Payer: Self-pay | Admitting: Family Medicine

## 2017-10-27 ENCOUNTER — Other Ambulatory Visit: Payer: Self-pay | Admitting: Family Medicine

## 2017-10-27 MED ORDER — OSELTAMIVIR PHOSPHATE 75 MG PO CAPS: 75.0000 mg | ORAL_CAPSULE | Freq: Every day | ORAL | 0 refills | Status: AC

## 2017-10-27 NOTE — Progress Notes (Signed)
Called in tamiflu Son has flu Prophylactic dose

## 2018-06-24 NOTE — Progress Notes (Signed)
HPI:  Using dictation device. Unfortunately this device frequently misinterprets words/phrases. Due for labs, pap Here for CPE:  -Concerns and/or follow up today:   Chronic medical problems summarized below were reviewed for changes. Marland Kitchen   Hx morbid obesity -made sig lifestyle change 2017 -wt 217 --> 196 (10/17) --> 182 (10/18)    -Diet: variety of foods, balance and well rounded, larger portion sizes -Exercise: no regular exercise -Taking folic acid, vitamin D or calcium: no -Diabetes and Dyslipidemia Screening: fasting for labs -Vaccines: see vaccine section EPIC -pap history: normal and hpv neg 05/2015 -FDLMP: see nursing notes -wants STI testing (Hep C if born [birth year]): yes -FH breast, colon or ovarian ca: see FH Last mammogram: n/a Last colon cancer screening: n/a Breast Ca Risk Assessment: see family history and pt history DEXA (>/= 65): n/a  -Alcohol, Tobacco, drug use: see social history  Review of Systems - no reported fevers, unintentional weight loss, vision loss, hearing loss, chest pain, sob, hemoptysis, melena, hematochezia, hematuria, genital discharge, changing or concerning skin lesions, bleeding, bruising, loc, thoughts of self harm or SI  Past Medical History:  Diagnosis Date  . Hypertension    Preeclampsia 2013  . Medical history non-contributory   . No pertinent past medical history     Past Surgical History:  Procedure Laterality Date  . NO PAST SURGERIES      Family History  Problem Relation Age of Onset  . Diabetes Father   . Hypertension Father     Social History   Socioeconomic History  . Marital status: Married    Spouse name: Not on file  . Number of children: Not on file  . Years of education: Not on file  . Highest education level: Not on file  Occupational History  . Not on file  Social Needs  . Financial resource strain: Not on file  . Food insecurity:    Worry: Not on file    Inability: Not on file  . Transportation  needs:    Medical: Not on file    Non-medical: Not on file  Tobacco Use  . Smoking status: Never Smoker  . Smokeless tobacco: Never Used  Substance and Sexual Activity  . Alcohol use: No  . Drug use: No  . Sexual activity: Never    Birth control/protection: None    Comment: no partners currently  Lifestyle  . Physical activity:    Days per week: Not on file    Minutes per session: Not on file  . Stress: Not on file  Relationships  . Social connections:    Talks on phone: Not on file    Gets together: Not on file    Attends religious service: Not on file    Active member of club or organization: Not on file    Attends meetings of clubs or organizations: Not on file    Relationship status: Not on file  Other Topics Concern  . Not on file  Social History Narrative   Work or School: Therapist, sports - orthopedics at [employer] Situation: lives with two children age 85 and 1 month      Spiritual Beliefs: none      Lifestyle: regular exercise, healthy diet chang ein 2017 --> lost 74lbs!       No current outpatient medications on file.  EXAM:  Vitals:   06/26/18 0946  Pulse: 71  Temp: 98.3 F (36.8 C)  Body mass index is 29.68 kg/m.  GENERAL: vitals reviewed and listed below, alert, oriented, appears well hydrated and in no acute distress  HEENT: head atraumatic, PERRLA, normal appearance of eyes, ears, nose and mouth. moist mucus membranes.  NECK: supple, no masses or lymphadenopathy  LUNGS: clear to auscultation bilaterally, no rales, rhonchi or wheeze  CV: HRRR, no peripheral edema or cyanosis, normal pedal pulses  ABDOMEN: bowel sounds normal, soft, non tender to palpation, no masses, no rebound or guarding  BREAST: normal appearance - no skin lesions or discharge noted on inspection of both breasts, on palpation of both breast and axillary region no suspicious lesions appreciated today  GU: normal appearance of external genitalia - no lesions or masses  appreciated, normal appearing vaginal mucosa - no abnormal discharge, normal appearance of cervix - no lesions or abnormal discharge observed  RECTAL: deferred  SKIN: no rash or abnormal lesions  MS: normal gait, moves all extremities normally  NEURO: normal gait, speech and thought processing grossly intact, muscle tone grossly intact throughout  PSYCH: normal affect, pleasant and cooperative  ASSESSMENT AND PLAN:  Discussed the following assessment and plan:  PREVENTIVE EXAM: -Discussed and advised all [place] preventive services health task force level A and B recommendations for age, sex and risks. -Advised at least 150 minutes of exercise per week and a healthy diet with avoidance of (less then 1 serving per week) processed foods, white starches, red meat, fast foods and sweets and consisting of: * 5-9 servings of fresh fruits and vegetables (not corn or potatoes) *nuts and seeds, beans *olives and olive oil *lean meats such as fish and white chicken  *whole grains -labs, studies and vaccines per orders this encounter -pap obtained -form for work physical completed  Patient advised to return to clinic immediately if symptoms worsen or persist or new concerns.  Patient Instructions  BEFORE YOU LEAVE: -weight -labs -follow up: yearly for physical and as needed  We have ordered labs and a pap smear at this visit. It can take up to 1-2 weeks for results and processing. IF results require follow up or explanation, we will call you with instructions. Clinically stable results will be released to your [facility]. If you have not heard from [place] or cannot find your results in [facility] in 2 weeks please contact our office at [phone].  If you are not yet signed up for [facility], please consider signing up.    Preventive Care 18-39 Years, Female Preventive care refers to lifestyle choices and visits with your health care provider that can promote health and wellness. What does preventive  care include?  A yearly physical exam. This is also called an annual well check.  Dental exams once or twice a year.  Routine eye exams. Ask your health care provider how often you should have your eyes checked.  Personal lifestyle choices, including: ? Daily care of your teeth and gums. ? Regular physical activity. ? Eating a healthy diet. ? Avoiding tobacco and drug use. ? Limiting alcohol use. ? Practicing safe sex. ? Taking vitamin and mineral supplements as recommended by your health care provider. What happens during an annual well check? The services and screenings done by your health care provider during your annual well check will depend on your age, overall health, lifestyle risk factors, and family history of disease. Counseling Your health care provider may ask you questions about your:  Alcohol use.  Tobacco use.  Drug use.  Emotional well-being.  Home and relationship well-being.  Sexual activity.  Eating habits.  Work and work Statistician.  Method of birth control.  Menstrual cycle.  Pregnancy history.  Screening You may have the following tests or measurements:  Height, weight, and BMI.  Diabetes screening. This is done by checking your blood sugar (glucose) after you have not eaten for a while (fasting).  Blood pressure.  Lipid and cholesterol levels. These may be checked every 5 years starting at age 27.  Skin check.  Hepatitis C blood test.  Hepatitis B blood test.  Sexually transmitted disease (STD) testing.  BRCA-related cancer screening. This may be done if you have a family history of breast, ovarian, tubal, or peritoneal cancers.  Pelvic exam and Pap test. This may be done every 3 years starting at age 8. Starting at age 13, this may be done every 5 years if you have a Pap test in combination with an HPV test.  Discuss your test results, treatment options, and if necessary, the need for more tests with your health care  provider. Vaccines Your health care provider may recommend certain vaccines, such as:  Influenza vaccine. This is recommended every year.  Tetanus, diphtheria, and acellular pertussis (Tdap, Td) vaccine. You may need a Td booster every 10 years.  Varicella vaccine. You may need this if you have not been vaccinated.  HPV vaccine. If you are 40 or younger, you may need three doses over 6 months.  Measles, mumps, and rubella (MMR) vaccine. You may need at least one dose of MMR. You may also need a second dose.  Pneumococcal 13-valent conjugate (PCV13) vaccine. You may need this if you have certain conditions and were not previously vaccinated.  Pneumococcal polysaccharide (PPSV23) vaccine. You may need one or two doses if you smoke cigarettes or if you have certain conditions.  Meningococcal vaccine. One dose is recommended if you are age 1-21 years and a first-year college student living in a residence hall, or if you have one of several medical conditions. You may also need additional booster doses.  Hepatitis A vaccine. You may need this if you have certain conditions or if you travel or work in places where you may be exposed to hepatitis A.  Hepatitis B vaccine. You may need this if you have certain conditions or if you travel or work in places where you may be exposed to hepatitis B.  Haemophilus influenzae type b (Hib) vaccine. You may need this if you have certain risk factors.  Talk to your health care provider about which screenings and vaccines you need and how often you need them. This information is not intended to replace advice given to you by your health care provider. Make sure you discuss any questions you have with your health care provider. Document Released: 10/19/2001 Document Revised: 05/12/2016 Document Reviewed: 06/24/2015 Elsevier Interactive Patient Education  2018 [employer].         No follow-ups on file.  Lucretia Kern, DO

## 2018-06-26 ENCOUNTER — Encounter: Payer: Self-pay | Admitting: Family Medicine

## 2018-06-26 ENCOUNTER — Ambulatory Visit (INDEPENDENT_AMBULATORY_CARE_PROVIDER_SITE_OTHER): Payer: 59 | Admitting: Family Medicine

## 2018-06-26 ENCOUNTER — Encounter: Payer: 59 | Admitting: Family Medicine

## 2018-06-26 ENCOUNTER — Other Ambulatory Visit (HOSPITAL_COMMUNITY)
Admission: RE | Admit: 2018-06-26 | Discharge: 2018-06-26 | Disposition: A | Discharge status: Home / Self Care | Payer: 59 | Source: Ambulatory Visit | Attending: Family Medicine | Admitting: Family Medicine

## 2018-06-26 VITALS — BP 112/68 | HR 71 | Temp 98.3°F | Ht 65.75 in | Wt 209.9 lb

## 2018-06-26 DIAGNOSIS — Z0000 Encounter for general adult medical examination without abnormal findings: Secondary | ICD-10-CM

## 2018-06-26 DIAGNOSIS — Z124 Encounter for screening for malignant neoplasm of cervix: Secondary | ICD-10-CM | POA: Diagnosis not present

## 2018-06-26 DIAGNOSIS — Z1331 Encounter for screening for depression: Secondary | ICD-10-CM

## 2018-06-26 LAB — LIPID PANEL
Cholesterol: 136 mg/dL (ref 0–200)
HDL: 57.6 mg/dL (ref >39.00)
LDL CALC: 72 mg/dL (ref 0–99)
NonHDL: 78.69
TRIGLYCERIDES: 33 mg/dL (ref 0.0–149.0)
Total CHOL/HDL Ratio: 2
VLDL: 6.6 mg/dL (ref 0.0–40.0)

## 2018-06-26 LAB — HEMOGLOBIN A1C: Hgb A1c MFr Bld: 5.3 % (ref 4.6–6.5)

## 2018-06-26 NOTE — Patient Instructions (Signed)
BEFORE YOU LEAVE: -weight -labs -follow up: yearly for physical and as needed  We have ordered labs and a pap smear at this visit. It can take up to 1-2 weeks for results and processing. IF results require follow up or explanation, we will call you with instructions. Clinically stable results will be released to your [facility]. If you have not heard from [place] or cannot find your results in [facility] in 2 weeks please contact our office at [phone].  If you are not yet signed up for [facility], please consider signing up.    Preventive Care 18-39 Years, Female Preventive care refers to lifestyle choices and visits with your health care provider that can promote health and wellness. What does preventive care include?  A yearly physical exam. This is also called an annual well check.  Dental exams once or twice a year.  Routine eye exams. Ask your health care provider how often you should have your eyes checked.  Personal lifestyle choices, including: ? Daily care of your teeth and gums. ? Regular physical activity. ? Eating a healthy diet. ? Avoiding tobacco and drug use. ? Limiting alcohol use. ? Practicing safe sex. ? Taking vitamin and mineral supplements as recommended by your health care provider. What happens during an annual well check? The services and screenings done by your health care provider during your annual well check will depend on your age, overall health, lifestyle risk factors, and family history of disease. Counseling Your health care provider may ask you questions about your:  Alcohol use.  Tobacco use.  Drug use.  Emotional well-being.  Home and relationship well-being.  Sexual activity.  Eating habits.  Work and work Statistician.  Method of birth control.  Menstrual cycle.  Pregnancy history.  Screening You may have the following tests or measurements:  Height, weight, and BMI.  Diabetes screening. This is done by checking your blood sugar  (glucose) after you have not eaten for a while (fasting).  Blood pressure.  Lipid and cholesterol levels. These may be checked every 5 years starting at age 7.  Skin check.  Hepatitis C blood test.  Hepatitis B blood test.  Sexually transmitted disease (STD) testing.  BRCA-related cancer screening. This may be done if you have a family history of breast, ovarian, tubal, or peritoneal cancers.  Pelvic exam and Pap test. This may be done every 3 years starting at age 65. Starting at age 28, this may be done every 5 years if you have a Pap test in combination with an HPV test.  Discuss your test results, treatment options, and if necessary, the need for more tests with your health care provider. Vaccines Your health care provider may recommend certain vaccines, such as:  Influenza vaccine. This is recommended every year.  Tetanus, diphtheria, and acellular pertussis (Tdap, Td) vaccine. You may need a Td booster every 10 years.  Varicella vaccine. You may need this if you have not been vaccinated.  HPV vaccine. If you are 39 or younger, you may need three doses over 6 months.  Measles, mumps, and rubella (MMR) vaccine. You may need at least one dose of MMR. You may also need a second dose.  Pneumococcal 13-valent conjugate (PCV13) vaccine. You may need this if you have certain conditions and were not previously vaccinated.  Pneumococcal polysaccharide (PPSV23) vaccine. You may need one or two doses if you smoke cigarettes or if you have certain conditions.  Meningococcal vaccine. One dose is recommended if you are age  19-21 years and a Market researcher living in a residence hall, or if you have one of several medical conditions. You may also need additional booster doses.  Hepatitis A vaccine. You may need this if you have certain conditions or if you travel or work in places where you may be exposed to hepatitis A.  Hepatitis B vaccine. You may need this if you have  certain conditions or if you travel or work in places where you may be exposed to hepatitis B.  Haemophilus influenzae type b (Hib) vaccine. You may need this if you have certain risk factors.  Talk to your health care provider about which screenings and vaccines you need and how often you need them. This information is not intended to replace advice given to you by your health care provider. Make sure you discuss any questions you have with your health care provider. Document Released: 10/19/2001 Document Revised: 05/12/2016 Document Reviewed: 06/24/2015 Elsevier Interactive Patient Education  [employer].

## 2018-06-27 LAB — CYTOLOGY - PAP
Bacterial vaginitis: NEGATIVE
Candida vaginitis: NEGATIVE
Chlamydia: NEGATIVE
DIAGNOSIS: NEGATIVE
HPV: NOT DETECTED
Neisseria Gonorrhea: NEGATIVE
Trichomonas: NEGATIVE

## 2018-06-27 LAB — HIV ANTIBODY (ROUTINE TESTING W REFLEX): HIV 1&2 Ab, 4th Generation: NONREACTIVE

## 2018-06-30 ENCOUNTER — Encounter: Payer: 59 | Admitting: Family Medicine

## 2018-09-13 DIAGNOSIS — Z111 Encounter for screening for respiratory tuberculosis: Secondary | ICD-10-CM | POA: Diagnosis not present

## 2018-09-15 DIAGNOSIS — Z111 Encounter for screening for respiratory tuberculosis: Secondary | ICD-10-CM | POA: Diagnosis not present

## 2018-09-21 DIAGNOSIS — H52223 Regular astigmatism, bilateral: Secondary | ICD-10-CM | POA: Diagnosis not present

## 2018-11-24 ENCOUNTER — Telehealth: Payer: Self-pay | Admitting: Family Medicine

## 2018-11-24 NOTE — Telephone Encounter (Signed)
Copied from CRM [id number]. Topic: Quick Communication - Rx Refill/Question >> Nov 24, 2018  4:40 PM Jens Som A wrote: Medication: Lowella Curb, 0.18/0.215/0.25 MG-35 MCG tablet [[id number]]  DISCONTINUED -Requesting Dr. Selena Batten to add back  Has the patient contacted their pharmacy? Yes  (Agent: If no, request that the patient contact the pharmacy for the refill.) (Agent: If yes, when and what did the pharmacy advise?)  Preferred Pharmacy (with phone number or street name):   [id number] - [place], [place] - [address] AT [facility] OF [address] [phone] (Phone) [phone] (Fax)    Agent: Please be advised that RX refills may take up to 3 business days. We ask that you follow-up with your pharmacy.

## 2018-11-28 NOTE — Telephone Encounter (Signed)
I called the pt and scheduled a Webex appt for 3/26 at 7:45am.  Patient asked about billing for this type of visit and I gave her the phone number for [facility] at [phone] and also advised she contact her insurance with questions.

## 2018-11-28 NOTE — Telephone Encounter (Signed)
Please set up virtual visit so can review prior to rx.  I have times on Thursday: 7:45, 9am, 2:15, 2:30, 2:30, 3 and 3: 30 that you can unblock if needed to use for webx appointments. Thank you.

## 2018-11-29 NOTE — Progress Notes (Signed)
Virtual Visit via Video Note  I connected with Cristina Murphy on 11/30/18 at  7:45 AM EDT by a video enabled telemedicine application and verified that I am speaking with the correct person using two identifiers.  Location patient: home Location provider:work or home office Persons participating in the virtual visit: patient, provider  I discussed the limitations of evaluation and management by telemedicine and the availability of in person appointments. The patient expressed understanding and agreed to proceed.   HPI:  Contraception Counseling: -wants to restart trinessa OCP -had her preventive visit in 06/2018 with pap and STI screening all normal -FDLMP: Mar 15th, 2020; regular and normal -sexual activity: one partner, not concerned for STI -hx of hypertension only with pregnancy -denies smoking, hx stroke, hx clotting, hx migraine with aura  ROS: See pertinent positives and negatives per HPI.  Past Medical History:  Diagnosis Date  . Hypertension    Preeclampsia 2013  . Medical history non-contributory   . No pertinent past medical history     Past Surgical History:  Procedure Laterality Date  . NO PAST SURGERIES      Family History  Problem Relation Age of Onset  . Diabetes Father   . Hypertension Father     SOCIAL HX: no smoking  No current outpatient medications on file.  EXAM:  VITALS per patient if applicable:  GENERAL: alert, oriented, appears well and in no acute distress  HEENT: atraumatic, conjunttiva clear, no obvious abnormalities on inspection of external nose and ears  NECK: normal movements of the head and neck  LUNGS: on inspection no signs of respiratory distress, breathing rate appears normal, no obvious gross SOB, gasping or wheezing  CV: no obvious cyanosis  MS: moves all visible extremities without noticeable abnormality  PSYCH/NEURO: pleasant and cooperative, no obvious depression or anxiety, speech and thought processing grossly  intact  ASSESSMENT AND PLAN:  Discussed the following assessment and plan:  Encounter for surveillance of contraceptive pills  Discussed various options and risks for birth control. She would prefer pill and opted for trinessa which she has used in the past. No concerns for STI. Discussed proper use and the importance of using in conjunction with condoms.    I discussed the assessment and treatment plan with the patient. The patient was provided an opportunity to ask questions and all were answered. The patient agreed with the plan and demonstrated an understanding of the instructions.  Follow up: -advised assistant, Ronnald Collum, to schedule TOC visit with Dr. Hassan Rowan in 3-6 months.  I provided 15 minutes of non-face-to-face time during this encounter.   [name], DO

## 2018-11-30 ENCOUNTER — Ambulatory Visit (INDEPENDENT_AMBULATORY_CARE_PROVIDER_SITE_OTHER): Payer: 59 | Admitting: Family Medicine

## 2018-11-30 ENCOUNTER — Other Ambulatory Visit: Payer: Self-pay

## 2018-11-30 DIAGNOSIS — Z3009 Encounter for other general counseling and advice on contraception: Secondary | ICD-10-CM | POA: Diagnosis not present

## 2018-11-30 DIAGNOSIS — Z3041 Encounter for surveillance of contraceptive pills: Secondary | ICD-10-CM | POA: Diagnosis not present

## 2018-11-30 MED ORDER — TRINESSA (28) 0.18/0.215/0.25 MG-35 MCG PO TABS: 1.0000 | ORAL_TABLET | Freq: Every day | ORAL | 3 refills | Status: DC

## 2018-12-04 ENCOUNTER — Telehealth: Payer: Self-pay | Admitting: Family Medicine

## 2018-12-04 ENCOUNTER — Ambulatory Visit: Payer: 59 | Admitting: Family Medicine

## 2018-12-04 MED ORDER — NORGESTIMATE-ETH ESTRADIOL 0.25-35 MG-MCG PO TABS: 1.0000 | ORAL_TABLET | Freq: Every day | ORAL | 3 refills | Status: DC

## 2018-12-04 NOTE — Telephone Encounter (Signed)
I called [facility] and spoke with [place]. She stated the alternatives are:  Sprintec or Estrella and the message was forwarded to Dr Selena Batten.

## 2018-12-04 NOTE — Telephone Encounter (Signed)
I called the pt and informed her Sprintec was sent to the pharmacy.  She asked if this was safe to take as the manufacturer is the same for Sprintec.  I advised her to discuss this with the pharmacist and she agreed.

## 2018-12-04 NOTE — Telephone Encounter (Signed)
Please check with her pharmacy to see what comparable product they carry and have in stock as there are many with the same generic ingredients. Ok to make substitution based on pharmacists recs. Thanks. Norgestimate/ethinyl estradiol 0.18mg /0.215mg /0.25mg /89mcg triphasic pack. Thanks.

## 2018-12-04 NOTE — Telephone Encounter (Signed)
Copied from CRM [id number]. Topic: Quick Communication - Rx Refill/Question >> Dec 04, 2018  8:40 AM Angela Nevin wrote: Medication: TRINESSA, 28, 0.18/0.215/0.25 MG-35 MCG tablet   Patient states she was advised by pharmacy to contact PCP as this medication has been discontinued. Patient thinks it may be part of a recall and would like to know if another medication, similar, can be sent in it's place. She is requesting call back from CMA to discuss further. Please advise.   Preferred Pharmacy (with phone number or street name):[id number] - [place], [place] - [address] AT [facility] OF [address]  [phone] (Phone) [phone] (Fax)

## 2018-12-04 NOTE — Telephone Encounter (Signed)
Pt seen 11/30/2018 for medication management to restart this medication.   Please advise on refill, thanks.

## 2018-12-04 NOTE — Telephone Encounter (Signed)
Ok to send sprintec. 3 packages with 3 refills. Take 1 pill daily per instructions. Thanks.

## 2018-12-04 NOTE — Addendum Note (Signed)
Addended by: Johnella Moloney on: 12/04/2018 11:01 AM   Modules accepted: Orders

## 2019-01-01 ENCOUNTER — Telehealth: Payer: Self-pay | Admitting: Family Medicine

## 2019-01-01 NOTE — Telephone Encounter (Signed)
Copied from CRM [id number]. Topic: Quick Communication - See Telephone Encounter >> Jan 01, 2019  3:45 PM Lorrine Kin, NT wrote: CRM for notification. See Telephone encounter for: 01/01/19. Patient calling and states that her pharmacy is only allowing her to pick up 1 pack of norgestimate-ethinyl estradiol (SPRINTEC 28) 0.25-35 MG-MCG tablet each month, not 3 packages for 1 month. States that she just wanted to make sure she would have the refills to last until 11/25/2019. Would like a call back to discuss. Please advise.

## 2019-01-02 NOTE — Telephone Encounter (Signed)
I called [facility] and spoke with Ethelene Browns, the pharmacy tech.  Ethelene Browns stated they do have the original Rx on file from 3/30 which was sent for 3 packages with 3 refills-which should last for 1 year).  He stated the pt only picked up one package on 4/3 and if she could call for the refill for May and ask for a 20-month supply or 84 pills.  I spoke with the pt and informed her of this and she agreed to call the pharmacy.

## 2019-03-22 NOTE — Progress Notes (Addendum)
Virtual Visit via Video Note  I connected with Cristina Murphy   on 03/23/19 at 10:00 AM EDT by a video enabled telemedicine application and verified that I am speaking with the correct person using two identifiers.  Location patient: home Location provider:work office Persons participating in the virtual visit: patient, provider  I discussed the limitations of evaluation and management by telemedicine and the availability of in person appointments. The patient expressed understanding and agreed to proceed.   Cristina Murphy DOB: 04/10/1986 Encounter date: 03/23/2019  This is a 33 y.o. female who presents to establish care. Chief Complaint  Patient presents with  . Establish Care   Last physical 06/2018 normal pap with normal lipid, glucose.   History of present illness: No concerns today.   HTN: running low now. Was having some issues with high blood pressure during pregancy, and on medication. Just towards end. Last bp check was here in office.   Weight has been stable.    She is scheduled for visit in November   Past Medical History:  Diagnosis Date  . Hypertension    Preeclampsia 2013  . Medical history non-contributory   . No pertinent past medical history    Past Surgical History:  Procedure Laterality Date  . NO PAST SURGERIES     No Known Allergies Current Meds  Medication Sig  . norgestimate-ethinyl estradiol (SPRINTEC 28) 0.25-35 MG-MCG tablet Take 1 tablet by mouth daily.  . [DISCONTINUED] TRINESSA, 28, 0.18/0.215/0.25 MG-35 MCG tablet Take 1 tablet by mouth daily.   Social History   Tobacco Use  . Smoking status: Never Smoker  . Smokeless tobacco: Never Used  Substance Use Topics  . Alcohol use: No   Family History  Problem Relation Age of Onset  . Diabetes Father   . Hypertension Father   . Hypertension Mother   . Healthy Sister   . Hypertension Maternal Grandmother   . Hypertension Paternal Grandmother   . Glaucoma Paternal Grandmother       Review of Systems  Constitutional: Negative for chills, fatigue and fever.  Respiratory: Negative for cough, chest tightness, shortness of breath and wheezing.   Cardiovascular: Negative for chest pain, palpitations and leg swelling.    Objective:  LMP 03/14/2019 (Exact Date)       BP Readings from Last 3 Encounters:  06/26/18 112/68  06/24/17 100/60  06/18/16 126/80   Wt Readings from Last 3 Encounters:  06/26/18 209 lb 14.4 oz (95.2 kg)  06/24/17 182 lb 8 oz (82.8 kg)  06/18/16 196 lb 6.4 oz (89.1 kg)    EXAM:  GENERAL: alert, oriented, appears well and in no acute distress  HEENT: atraumatic, conjunctiva clear, no obvious abnormalities on inspection of external nose and ears  NECK: normal movements of the head and neck  LUNGS: on inspection no signs of respiratory distress, breathing rate appears normal, no obvious gross SOB, gasping or wheezing  CV: no obvious cyanosis  MS: moves all visible extremities without noticeable abnormality  PSYCH/NEURO: pleasant and cooperative, no obvious depression or anxiety, speech and thought processing grossly intact   Assessment/Plan 1. Encounter for surveillance of contraceptive pills She is doing well with the pill that was started. She already has follow up visit scheduled.   All histories were reviewed with patient.    I discussed the assessment and treatment plan with the patient. The patient was provided an opportunity to ask questions and all were answered. The patient agreed with the plan and demonstrated an  understanding of the instructions.   The patient was advised to call back or seek an in-person evaluation if the symptoms worsen or if the condition fails to improve as anticipated.  I provided 15 minutes of non-face-to-face time during this encounter.   Micheline Rough, MD

## 2019-03-23 ENCOUNTER — Encounter: Payer: Self-pay | Admitting: Family Medicine

## 2019-03-23 ENCOUNTER — Other Ambulatory Visit: Payer: Self-pay

## 2019-03-23 ENCOUNTER — Ambulatory Visit (INDEPENDENT_AMBULATORY_CARE_PROVIDER_SITE_OTHER): Payer: 59 | Admitting: Family Medicine

## 2019-03-23 DIAGNOSIS — Z3041 Encounter for surveillance of contraceptive pills: Secondary | ICD-10-CM | POA: Diagnosis not present

## 2019-04-20 ENCOUNTER — Telehealth: Payer: Self-pay | Admitting: *Deleted

## 2019-04-20 NOTE — Telephone Encounter (Signed)
Copied from [place] [id number]. Topic: Appointment Scheduling - Scheduling Inquiry for Clinic >> Mar 21, 2019 10:44 AM Valla Leaver wrote: Reason for CRM: Patient wants to know if her appt this Friday 07/17 can be done over the phone or virtually?

## 2019-04-20 NOTE — Telephone Encounter (Signed)
Patient informed. 

## 2019-07-02 ENCOUNTER — Encounter: Payer: 59 | Admitting: Family Medicine

## 2019-07-09 ENCOUNTER — Other Ambulatory Visit: Payer: Self-pay

## 2019-07-09 ENCOUNTER — Ambulatory Visit (INDEPENDENT_AMBULATORY_CARE_PROVIDER_SITE_OTHER): Payer: 59 | Admitting: Family Medicine

## 2019-07-09 ENCOUNTER — Encounter: Payer: Self-pay | Admitting: Family Medicine

## 2019-07-09 VITALS — BP 140/94 | HR 86 | Temp 96.3°F | Ht 65.75 in

## 2019-07-09 DIAGNOSIS — N649 Disorder of breast, unspecified: Secondary | ICD-10-CM

## 2019-07-09 DIAGNOSIS — R748 Abnormal levels of other serum enzymes: Secondary | ICD-10-CM | POA: Diagnosis not present

## 2019-07-09 DIAGNOSIS — Z0000 Encounter for general adult medical examination without abnormal findings: Secondary | ICD-10-CM

## 2019-07-09 DIAGNOSIS — Z833 Family history of diabetes mellitus: Secondary | ICD-10-CM | POA: Diagnosis not present

## 2019-07-09 DIAGNOSIS — D649 Anemia, unspecified: Secondary | ICD-10-CM | POA: Diagnosis not present

## 2019-07-09 DIAGNOSIS — Z13220 Encounter for screening for lipoid disorders: Secondary | ICD-10-CM

## 2019-07-09 LAB — CBC WITH DIFFERENTIAL/PLATELET
Basophils Absolute: 0.1 10*3/uL (ref 0.0–0.1)
Basophils Relative: 0.7 % (ref 0.0–3.0)
Eosinophils Absolute: 0 10*3/uL (ref 0.0–0.7)
Eosinophils Relative: 0.6 % (ref 0.0–5.0)
HCT: 37.1 % (ref 36.0–46.0)
Hemoglobin: 11.9 g/dL — ABNORMAL LOW (ref 12.0–15.0)
Lymphocytes Relative: 26.1 % (ref 12.0–46.0)
Lymphs Abs: 1.8 10*3/uL (ref 0.7–4.0)
MCHC: 32 g/dL (ref 30.0–36.0)
MCV: 89.9 fl (ref 78.0–100.0)
Monocytes Absolute: 0.6 10*3/uL (ref 0.1–1.0)
Monocytes Relative: 8 % (ref 3.0–12.0)
Neutro Abs: 4.5 10*3/uL (ref 1.4–7.7)
Neutrophils Relative %: 64.6 % (ref 43.0–77.0)
Platelets: 227 10*3/uL (ref 150.0–400.0)
RBC: 4.13 Mil/uL (ref 3.87–5.11)
RDW: 13.2 % (ref 11.5–15.5)
WBC: 6.9 10*3/uL (ref 4.0–10.5)

## 2019-07-09 LAB — COMPREHENSIVE METABOLIC PANEL
ALT: 11 U/L (ref 0–35)
AST: 21 U/L (ref 0–37)
Albumin: 4 g/dL (ref 3.5–5.2)
Alkaline Phosphatase: 61 U/L (ref 39–117)
BUN: 12 mg/dL (ref 6–23)
CO2: 28 mEq/L (ref 19–32)
Calcium: 8.9 mg/dL (ref 8.4–10.5)
Chloride: 103 mEq/L (ref 96–112)
Creatinine, Ser: 0.9 mg/dL (ref 0.40–1.20)
GFR: 87.44 mL/min (ref >60.00)
Glucose, Bld: 87 mg/dL (ref 70–99)
Potassium: 4 mEq/L (ref 3.5–5.1)
Sodium: 137 mEq/L (ref 135–145)
Total Bilirubin: 0.4 mg/dL (ref 0.2–1.2)
Total Protein: 6.8 g/dL (ref 6.0–8.3)

## 2019-07-09 LAB — LIPID PANEL
Cholesterol: 143 mg/dL (ref 0–200)
HDL: 63.3 mg/dL (ref >39.00)
LDL Cholesterol: 71 mg/dL (ref 0–99)
NonHDL: 79.37
Total CHOL/HDL Ratio: 2
Triglycerides: 40 mg/dL (ref 0.0–149.0)
VLDL: 8 mg/dL (ref 0.0–40.0)

## 2019-07-09 LAB — HEMOGLOBIN A1C: Hgb A1c MFr Bld: 5.1 % (ref 4.6–6.5)

## 2019-07-09 NOTE — Progress Notes (Signed)
Minaal CRISTIANA YOCHIM DOB: [birth year] Encounter date: 07/09/2019  This is a 33 y.o. female who presents for complete physical   History of present illness/Additional concerns:  No specific concerns today.   Last pap 06/2018 was negative HPV, normal pap Cholesterol stable at that time and no evidence diabetes.  Exercising regularly.   Blood pressure running high today. Usually heart rate is 48-52.   Past Medical History:  Diagnosis Date  . Hypertension    Preeclampsia 2013   Past Surgical History:  Procedure Laterality Date  . NO PAST SURGERIES     No Known Allergies Current Meds  Medication Sig  . norgestimate-ethinyl estradiol (SPRINTEC 28) 0.25-35 MG-MCG tablet Take 1 tablet by mouth daily.   Social History   Tobacco Use  . Smoking status: Never Smoker  . Smokeless tobacco: Never Used  Substance Use Topics  . Alcohol use: No   Family History  Problem Relation Age of Onset  . Diabetes Father   . Hypertension Father   . Hypertension Mother   . Healthy Sister   . Hypertension Maternal Grandmother   . Hypertension Paternal Grandmother   . Glaucoma Paternal Grandmother      Review of Systems  Constitutional: Negative for activity change, appetite change, chills, fatigue, fever and unexpected weight change.  HENT: Negative for congestion, ear pain, hearing loss, sinus pressure, sinus pain, sore throat and trouble swallowing.   Eyes: Negative for pain and visual disturbance.  Respiratory: Negative for cough, chest tightness, shortness of breath and wheezing.   Cardiovascular: Negative for chest pain, palpitations and leg swelling.  Gastrointestinal: Negative for abdominal pain, blood in stool, constipation, diarrhea, nausea and vomiting.  Genitourinary: Negative for difficulty urinating and menstrual problem.  Musculoskeletal: Negative for arthralgias and back pain.  Skin: Negative for rash.  Neurological: Negative for dizziness, weakness, numbness and headaches.   Hematological: Negative for adenopathy. Does not bruise/bleed easily.  Psychiatric/Behavioral: Negative for sleep disturbance and suicidal ideas. The patient is not nervous/anxious.     CBC:  Lab Results  Component Value Date   WBC 12.8 (H) 08/17/2013   HGB 10.1 (L) 08/17/2013   HGB 11.2 10/25/2011   HCT 31.4 (L) 08/17/2013   HCT 36 10/25/2011   MCH 27.4 08/17/2013   MCHC 32.2 08/17/2013   RDW 14.3 08/17/2013   PLT 188 08/17/2013   PLT 333 10/25/2011   CMP: Lab Results  Component Value Date   NA 135 08/16/2013   K 3.9 08/16/2013   CL 103 08/16/2013   CO2 21 08/16/2013   GLUCOSE 89 08/16/2013   BUN 8 08/16/2013   CREATININE 0.68 08/16/2013   GFRAA >90 08/16/2013   CALCIUM 8.9 08/16/2013   PROT 6.2 08/16/2013   BILITOT 0.2 (L) 08/16/2013   ALKPHOS 165 (H) 08/16/2013   ALT 12 08/16/2013   AST 16 08/16/2013   LIPID: Lab Results  Component Value Date   CHOL 136 06/26/2018   TRIG 33.0 06/26/2018   HDL 57.60 06/26/2018   LDLCALC 72 06/26/2018    Objective:  BP (!) 140/94 (BP Location: Right Arm, Patient Position: Sitting, Cuff Size: Large)   Pulse 86   Temp (!) 96.3 F (35.7 C) (Temporal)   Ht 5' 5.75" (1.67 m)   LMP 06/13/2019 (Exact Date)   SpO2 97%   BMI 34.14 kg/m       BP Readings from Last 3 Encounters:  07/09/19 (!) 140/94  06/26/18 112/68  06/24/17 100/60   Wt Readings from Last 3 Encounters:  06/26/18 209 lb 14.4 oz (95.2 kg)  06/24/17 182 lb 8 oz (82.8 kg)  06/18/16 196 lb 6.4 oz (89.1 kg)    Physical Exam Constitutional:      General: She is not in acute distress.    Appearance: She is well-developed.  HENT:     Head: Normocephalic and atraumatic.     Right Ear: External ear normal.     Left Ear: External ear normal.     Mouth/Throat:     Pharynx: No oropharyngeal exudate.  Eyes:     Conjunctiva/sclera: Conjunctivae normal.     Pupils: Pupils are equal, round, and reactive to light.  Neck:     Musculoskeletal: Normal range of  motion and neck supple.     Thyroid: No thyromegaly.  Cardiovascular:     Rate and Rhythm: Normal rate and regular rhythm.     Heart sounds: Normal heart sounds. No murmur. No friction rub. No gallop.   Pulmonary:     Effort: Pulmonary effort is normal.     Breath sounds: Normal breath sounds.  Chest:     Breasts:        Left: Absent.     Comments: There is a skin tag appearing aspect superior right nipple and there is more erythema below this area.  Abdominal:     General: Bowel sounds are normal. There is no distension.     Palpations: Abdomen is soft. There is no mass.     Tenderness: There is no abdominal tenderness. There is no guarding.     Hernia: No hernia is present.  Musculoskeletal: Normal range of motion.        General: No tenderness or deformity.  Lymphadenopathy:     Cervical: No cervical adenopathy.  Skin:    General: Skin is warm and dry.     Findings: No rash.  Neurological:     Mental Status: She is alert and oriented to person, place, and time.     Deep Tendon Reflexes: Reflexes normal.     Reflex Scores:      Tricep reflexes are 2+ on the right side and 2+ on the left side.      Bicep reflexes are 2+ on the right side and 2+ on the left side.      Brachioradialis reflexes are 2+ on the right side and 2+ on the left side.      Patellar reflexes are 2+ on the right side and 2+ on the left side. Psychiatric:        Speech: Speech normal.        Behavior: Behavior normal.        Thought Content: Thought content normal.     Assessment/Plan: There are no preventive care reminders to display for this patient. Health Maintenance reviewed.  1. Preventative health care Keep up with regular exercise. Getting back to intensity of work outs that she was doing before.   2. Anemia, unspecified type - CBC with Differential/Platelet; Future  3. Elevated alkaline phosphatase level - Comprehensive metabolic panel; Future  4. Lipid screening - Lipid panel;  Future  5. Family history of diabetes mellitus - Hemoglobin A1c; Future  Return in about 1 year (around 07/08/2020) for physical exam.  Theodis Shove, MD

## 2019-07-11 ENCOUNTER — Telehealth: Payer: Self-pay | Admitting: *Deleted

## 2019-07-11 ENCOUNTER — Other Ambulatory Visit: Payer: Self-pay | Admitting: Family Medicine

## 2019-07-11 DIAGNOSIS — Z113 Encounter for screening for infections with a predominantly sexual mode of transmission: Secondary | ICD-10-CM

## 2019-07-11 NOTE — Telephone Encounter (Signed)
Copied from [place] [id number]. Topic: General - Other >> Jul 11, 2019 12:37 PM Cristina Murphy wrote: Reason for CRM: Pt stated she typically is tested for STI during labs for her physical but this year she was not tested. Pt would like to be tested for STI. Pt requests call back

## 2019-07-11 NOTE — Telephone Encounter (Signed)
I called the pt and she stated she has been sexually active x1 year and would like to have a test for HIV and other STI testing if covered.  Patient was advised to call her insurance company to check for coverage as this is not something I can confirm.  Message sent to Dr Ethlyn Gallery for orders.

## 2019-07-11 NOTE — Telephone Encounter (Signed)
Ok. I have ordered HIV, chlamydia, gonorrhea, trichomonas, syphillis screening. I usually do not check for herpes antibodies unless desired because majority of people have been exposed and will test positive for these without necessarily having a "new" infection. Risk of Hep C would be low but if she would like this added as well please do so.

## 2019-07-11 NOTE — Telephone Encounter (Signed)
Happy to test her for STIs. Looks like she had C/G checked last year with pap and HIV was done through bloodwork (I hadn't seen they were regularly doing full STD panel yearly). If there is something specific she is concerned about let me know. Was documented on social intake that she is not sexual active but if has been it is reasonable to check. We can certainly check STD panel for her if desired.

## 2019-07-12 NOTE — Telephone Encounter (Signed)
I called the pt and informed her of the message below.  Patient declined testing for herpes or Hep C and a lab appt was scheduled for 11/6.

## 2019-07-13 ENCOUNTER — Other Ambulatory Visit: Payer: 59

## 2019-07-13 ENCOUNTER — Other Ambulatory Visit: Payer: Self-pay

## 2019-07-13 DIAGNOSIS — Z113 Encounter for screening for infections with a predominantly sexual mode of transmission: Secondary | ICD-10-CM | POA: Diagnosis not present

## 2019-07-13 NOTE — Addendum Note (Signed)
Addended by: Kaleesi Guyton on: 07/13/2019 02:16 PM   Modules accepted: Orders  

## 2019-07-13 NOTE — Addendum Note (Signed)
Addended by: Suzette Battiest on: 07/13/2019 02:16 PM   Modules accepted: Orders

## 2019-07-14 LAB — RPR: RPR Ser Ql: NONREACTIVE

## 2019-07-14 LAB — HIV ANTIBODY (ROUTINE TESTING W REFLEX): HIV Screen 4th Generation wRfx: NONREACTIVE

## 2019-07-16 LAB — CHLAMYDIA/GONOCOCCUS/TRICHOMONAS, NAA
Chlamydia by NAA: NEGATIVE
Gonococcus by NAA: NEGATIVE
Trich vag by NAA: NEGATIVE

## 2019-09-23 ENCOUNTER — Encounter: Payer: Self-pay | Admitting: Family Medicine

## 2019-09-24 ENCOUNTER — Other Ambulatory Visit: Payer: Self-pay | Admitting: *Deleted

## 2019-09-24 MED ORDER — NORGESTIM-ETH ESTRAD TRIPHASIC 0.18/0.215/0.25 MG-35 MCG PO TABS: 1.0000 | ORAL_TABLET | Freq: Every day | ORAL | 3 refills | Status: DC

## 2019-09-24 NOTE — Telephone Encounter (Signed)
Ok for refill. Our system has sprintec listed (insurance may have substituted?) so please just confirm med/dose. Ok for 3 mo w 3 refills

## 2019-09-29 DIAGNOSIS — H52223 Regular astigmatism, bilateral: Secondary | ICD-10-CM | POA: Diagnosis not present

## 2019-10-10 ENCOUNTER — Encounter: Payer: Self-pay | Admitting: Family Medicine

## 2019-10-10 ENCOUNTER — Other Ambulatory Visit: Payer: Self-pay | Admitting: Family Medicine

## 2019-10-10 MED ORDER — NORGESTIMATE-ETH ESTRADIOL 0.25-35 MG-MCG PO TABS: 1.0000 | ORAL_TABLET | Freq: Every day | ORAL | 3 refills | Status: DC

## 2019-10-18 ENCOUNTER — Other Ambulatory Visit: Payer: Self-pay | Admitting: *Deleted

## 2019-10-18 ENCOUNTER — Other Ambulatory Visit: Payer: Self-pay | Admitting: Family Medicine

## 2019-10-18 MED ORDER — NORGESTIMATE-ETH ESTRADIOL 0.25-35 MG-MCG PO TABS: 1.0000 | ORAL_TABLET | Freq: Every day | ORAL | 1 refills | Status: DC

## 2019-10-18 NOTE — Telephone Encounter (Signed)
Rx done. 

## 2019-11-16 ENCOUNTER — Encounter: Payer: Self-pay | Admitting: Family Medicine

## 2019-11-16 MED ORDER — NORGESTIMATE-ETH ESTRADIOL 0.25-35 MG-MCG PO TABS: 1.0000 | ORAL_TABLET | Freq: Every day | ORAL | 2 refills | Status: DC

## 2020-04-27 ENCOUNTER — Other Ambulatory Visit: Payer: Self-pay | Admitting: Family Medicine

## 2020-04-28 ENCOUNTER — Other Ambulatory Visit: Payer: Self-pay | Admitting: *Deleted

## 2020-04-28 MED ORDER — NORGESTIMATE-ETH ESTRADIOL 0.25-35 MG-MCG PO TABS: 1.0000 | ORAL_TABLET | Freq: Every day | ORAL | 1 refills | Status: DC

## 2020-04-28 NOTE — Telephone Encounter (Signed)
Rx done. 

## 2020-07-09 ENCOUNTER — Encounter: Payer: 59 | Admitting: Family Medicine

## 2020-07-16 ENCOUNTER — Other Ambulatory Visit: Payer: Self-pay

## 2020-07-16 ENCOUNTER — Encounter: Payer: Self-pay | Admitting: Family Medicine

## 2020-07-16 ENCOUNTER — Ambulatory Visit: Payer: 59 | Admitting: Family Medicine

## 2020-07-16 VITALS — BP 140/82 | HR 97 | Temp 98.4°F

## 2020-07-16 DIAGNOSIS — D649 Anemia, unspecified: Secondary | ICD-10-CM

## 2020-07-16 DIAGNOSIS — R030 Elevated blood-pressure reading, without diagnosis of hypertension: Secondary | ICD-10-CM | POA: Diagnosis not present

## 2020-07-16 DIAGNOSIS — Z833 Family history of diabetes mellitus: Secondary | ICD-10-CM | POA: Diagnosis not present

## 2020-07-16 DIAGNOSIS — Z13220 Encounter for screening for lipoid disorders: Secondary | ICD-10-CM

## 2020-07-16 DIAGNOSIS — E669 Obesity, unspecified: Secondary | ICD-10-CM | POA: Diagnosis not present

## 2020-07-16 DIAGNOSIS — Z0000 Encounter for general adult medical examination without abnormal findings: Secondary | ICD-10-CM | POA: Diagnosis not present

## 2020-07-16 DIAGNOSIS — R5383 Other fatigue: Secondary | ICD-10-CM | POA: Diagnosis not present

## 2020-07-16 DIAGNOSIS — Z113 Encounter for screening for infections with a predominantly sexual mode of transmission: Secondary | ICD-10-CM

## 2020-07-16 NOTE — Progress Notes (Signed)
Cristina Murphy DOB: 1985-12-12 Encounter date: 07/16/2020  This is a 34 y.o. female who presents for complete physical   History of present illness/Additional concerns: Last visit with me was 07/09/19 for preventative care.   No worries or concerns today.   Low 120's systolic over mid 70's-80s when she checks at home. Last check was a month ago.   Not doing boot camp any more due to scheduling issues. Does work out at home daily - does Environmental health practitioner camp classes; but just not the same.   Eating is 50/50 for being healthy. Just depends on day. She refused weight today since she knows that weight is up. Suspects she is closer to 250lb. She had lost a significant amount of weight (about 100lbs) a few years back with doing boot camp and strict eating. She is frustrated that she is working out daily and not getting any results.   She was on bp medication after pregnancy short term; and was stopped at follow up visit.   Still taking ocp. Period lasts 4-5 days. At most changing TID.   Past Medical History:  Diagnosis Date  . Hypertension    Preeclampsia 2013   Past Surgical History:  Procedure Laterality Date  . NO PAST SURGERIES     No Known Allergies Current Meds  Medication Sig  . norgestimate-ethinyl estradiol (ESTARYLLA) 0.25-35 MG-MCG tablet Take 1 tablet by mouth daily.   Social History   Tobacco Use  . Smoking status: Never Smoker  . Smokeless tobacco: Never Used  Substance Use Topics  . Alcohol use: No   Family History  Problem Relation Age of Onset  . Diabetes Father   . Hypertension Father   . Hypertension Mother   . Healthy Sister   . Hypertension Maternal Grandmother   . Hypertension Paternal Grandmother   . Glaucoma Paternal Grandmother      Review of Systems  Constitutional: Negative for activity change, appetite change, chills, fatigue, fever and unexpected weight change.  HENT: Negative for congestion, ear pain, hearing loss, sinus pressure, sinus pain,  sore throat and trouble swallowing.   Eyes: Negative for pain and visual disturbance.  Respiratory: Negative for cough, chest tightness, shortness of breath and wheezing.   Cardiovascular: Negative for chest pain, palpitations and leg swelling.  Gastrointestinal: Negative for abdominal pain, blood in stool, constipation, diarrhea, nausea and vomiting.  Genitourinary: Negative for difficulty urinating and menstrual problem.  Musculoskeletal: Negative for arthralgias and back pain.  Skin: Negative for rash.  Neurological: Negative for dizziness, weakness, numbness and headaches.  Hematological: Negative for adenopathy. Does not bruise/bleed easily.  Psychiatric/Behavioral: Negative for sleep disturbance and suicidal ideas. The patient is not nervous/anxious.     CBC:  Lab Results  Component Value Date   WBC 10.2 07/16/2020   HGB 12.2 07/16/2020   HGB 11.2 10/25/2011   HCT 37.4 07/16/2020   HCT 36 10/25/2011   MCH 29.0 07/16/2020   MCHC 32.6 07/16/2020   RDW 12.1 07/16/2020   PLT 284 07/16/2020   PLT 333 10/25/2011   MPV 12.1 07/16/2020   CMP: Lab Results  Component Value Date   NA 137 07/16/2020   K 3.9 07/16/2020   CL 103 07/16/2020   CO2 25 07/16/2020   GLUCOSE 75 07/16/2020   BUN 11 07/16/2020   CREATININE 0.87 07/16/2020   GFRAA >90 08/16/2013   CALCIUM 9.4 07/16/2020   PROT 7.0 07/16/2020   BILITOT 0.4 07/16/2020   ALKPHOS 61 07/09/2019   ALT 10  07/16/2020   AST 18 07/16/2020   LIPID: Lab Results  Component Value Date   CHOL 167 07/16/2020   TRIG 56 07/16/2020   HDL 77 07/16/2020   LDLCALC 76 07/16/2020    Objective:  BP 140/82   Pulse 97   Temp 98.4 F (36.9 C) (Oral)   LMP 06/24/2020 (Exact Date)       BP Readings from Last 3 Encounters:  07/16/20 140/82  07/09/19 (!) 140/94  06/26/18 112/68   Wt Readings from Last 3 Encounters:  06/26/18 209 lb 14.4 oz (95.2 kg)  06/24/17 182 lb 8 oz (82.8 kg)  06/18/16 196 lb 6.4 oz (89.1 kg)     Physical Exam Constitutional:      General: She is not in acute distress.    Appearance: She is well-developed. She is obese.  HENT:     Head: Normocephalic and atraumatic.     Right Ear: External ear normal.     Left Ear: External ear normal.     Mouth/Throat:     Pharynx: No oropharyngeal exudate.  Eyes:     Conjunctiva/sclera: Conjunctivae normal.     Pupils: Pupils are equal, round, and reactive to light.  Neck:     Thyroid: No thyromegaly.  Cardiovascular:     Rate and Rhythm: Normal rate and regular rhythm.     Heart sounds: Normal heart sounds. No murmur heard.  No friction rub. No gallop.   Pulmonary:     Effort: Pulmonary effort is normal.     Breath sounds: Normal breath sounds.  Chest:     Comments: She does have a soft lesion right nipple- nontender; has been stable in size since appearance. Appeared when breast feeding. States that milk did come from this area when she was breast feeding. See pictures; I suspect granuloma secondary to breast feeding.  Abdominal:     General: Bowel sounds are normal. There is no distension.     Palpations: Abdomen is soft. There is no mass.     Tenderness: There is no abdominal tenderness. There is no guarding.     Hernia: No hernia is present.  Musculoskeletal:        General: No tenderness or deformity. Normal range of motion.     Cervical back: Normal range of motion and neck supple.  Lymphadenopathy:     Cervical: No cervical adenopathy.  Skin:    General: Skin is warm and dry.     Findings: No rash.  Neurological:     Mental Status: She is alert and oriented to person, place, and time.     Deep Tendon Reflexes: Reflexes normal.     Reflex Scores:      Tricep reflexes are 2+ on the right side and 2+ on the left side.      Bicep reflexes are 2+ on the right side and 2+ on the left side.      Brachioradialis reflexes are 2+ on the right side and 2+ on the left side.      Patellar reflexes are 2+ on the right side and 2+  on the left side. Psychiatric:        Speech: Speech normal.        Behavior: Behavior normal.        Thought Content: Thought content normal.         Assessment/Plan: Health Maintenance Due  Topic Date Due  . Hepatitis C Screening  Never done   Health Maintenance reviewed.  1.  Preventative health care Encouraged her to keep up with regular exercise. We are going to get some bloodwork today to make sure there is nothing that we can help with in terms of weight loss or energy level. Pending these results will determine plan to help with achieving weight goals.  2. Anemia, unspecified type - Vitamin B12; Future - Iron, TIBC and Ferritin Panel; Future - Iron, TIBC and Ferritin Panel - Vitamin B12  3. Obesity, unspecified classification, unspecified obesity type, unspecified whether serious comorbidity present See above; she did ask about phentermine, but we may be limited by her blood pressure. We can discuss other options that may help with weight loss pending results.  4. Fatigue, unspecified type - CBC with Differential/Platelet; Future - Comprehensive metabolic panel; Future - Vitamin B12; Future - VITAMIN D 25 Hydroxy (Vit-D Deficiency, Fractures); Future - TSH; Future - TSH - VITAMIN D 25 Hydroxy (Vit-D Deficiency, Fractures) - Vitamin B12 - Comprehensive metabolic panel - CBC with Differential/Platelet  5. Lipid screening - Lipid panel; Future - Lipid panel  6. Family history of diabetes mellitus - Hemoglobin A1c; Future - Hemoglobin A1c  7. Elevated blood pressure reading  bp elevated today but has been low previously (elevated when sick at urgent care but otherwise low). Will have her check at home; compare home cuff to work cuff and report back to me.  Return for pending labs and blood pressure report.   Theodis Shove, MD

## 2020-07-17 LAB — COMPREHENSIVE METABOLIC PANEL
AG Ratio: 1.3 (calc) (ref 1.0–2.5)
ALT: 10 U/L (ref 6–29)
AST: 18 U/L (ref 10–30)
Albumin: 4 g/dL (ref 3.6–5.1)
Alkaline phosphatase (APISO): 64 U/L (ref 31–125)
BUN: 11 mg/dL (ref 7–25)
CO2: 25 mmol/L (ref 20–32)
Calcium: 9.4 mg/dL (ref 8.6–10.2)
Chloride: 103 mmol/L (ref 98–110)
Creat: 0.87 mg/dL (ref 0.50–1.10)
Globulin: 3 g/dL (calc) (ref 1.9–3.7)
Glucose, Bld: 75 mg/dL (ref 65–99)
Potassium: 3.9 mmol/L (ref 3.5–5.3)
Sodium: 137 mmol/L (ref 135–146)
Total Bilirubin: 0.4 mg/dL (ref 0.2–1.2)
Total Protein: 7 g/dL (ref 6.1–8.1)

## 2020-07-17 LAB — CBC WITH DIFFERENTIAL/PLATELET
Absolute Monocytes: 745 cells/uL (ref 200–950)
Basophils Absolute: 61 cells/uL (ref 0–200)
Basophils Relative: 0.6 %
Eosinophils Absolute: 61 cells/uL (ref 15–500)
Eosinophils Relative: 0.6 %
HCT: 37.4 % (ref 35.0–45.0)
Hemoglobin: 12.2 g/dL (ref 11.7–15.5)
Lymphs Abs: 2224 cells/uL (ref 850–3900)
MCH: 29 pg (ref 27.0–33.0)
MCHC: 32.6 g/dL (ref 32.0–36.0)
MCV: 88.8 fL (ref 80.0–100.0)
MPV: 12.1 fL (ref 7.5–12.5)
Monocytes Relative: 7.3 %
Neutro Abs: 7109 cells/uL (ref 1500–7800)
Neutrophils Relative %: 69.7 %
Platelets: 284 10*3/uL (ref 140–400)
RBC: 4.21 10*6/uL (ref 3.80–5.10)
RDW: 12.1 % (ref 11.0–15.0)
Total Lymphocyte: 21.8 %
WBC: 10.2 10*3/uL (ref 3.8–10.8)

## 2020-07-17 LAB — HEMOGLOBIN A1C
Hgb A1c MFr Bld: 5.1 % of total Hgb (ref <5.7)
Mean Plasma Glucose: 100 (calc)
eAG (mmol/L): 5.5 (calc)

## 2020-07-17 LAB — IRON,TIBC AND FERRITIN PANEL
%SAT: 26 % (calc) (ref 16–45)
Ferritin: 10 ng/mL — ABNORMAL LOW (ref 16–154)
Iron: 121 ug/dL (ref 40–190)
TIBC: 461 mcg/dL (calc) — ABNORMAL HIGH (ref 250–450)

## 2020-07-17 LAB — LIPID PANEL
Cholesterol: 167 mg/dL (ref <200)
HDL: 77 mg/dL (ref >=50)
LDL Cholesterol (Calc): 76 mg/dL (calc)
Non-HDL Cholesterol (Calc): 90 mg/dL (calc) (ref <130)
Total CHOL/HDL Ratio: 2.2 (calc) (ref <5.0)
Triglycerides: 56 mg/dL (ref <150)

## 2020-07-17 LAB — VITAMIN D 25 HYDROXY (VIT D DEFICIENCY, FRACTURES): Vit D, 25-Hydroxy: 9 ng/mL — ABNORMAL LOW (ref 30–100)

## 2020-07-17 LAB — VITAMIN B12: Vitamin B-12: 268 pg/mL (ref 200–1100)

## 2020-07-17 LAB — TSH: TSH: 1.02 mIU/L

## 2020-07-21 MED ORDER — VITAMIN D (ERGOCALCIFEROL) 1.25 MG (50000 UNIT) PO CAPS: 50000.0000 [IU] | ORAL_CAPSULE | ORAL | 1 refills | Status: DC

## 2020-07-21 NOTE — Addendum Note (Signed)
Addended by: Johnella Moloney on: 07/21/2020 02:14 PM   Modules accepted: Orders

## 2020-07-23 ENCOUNTER — Other Ambulatory Visit: Payer: Self-pay | Admitting: Family Medicine

## 2020-07-23 MED ORDER — NORGESTIMATE-ETH ESTRADIOL 0.25-35 MG-MCG PO TABS
1.0000 | ORAL_TABLET | Freq: Every day | ORAL | 3 refills | Status: DC
Start: 2020-07-23 — End: 2020-10-15

## 2020-07-24 NOTE — Addendum Note (Signed)
Addended by: Johnella Moloney on: 07/24/2020 03:11 PM   Modules accepted: Orders

## 2020-07-25 ENCOUNTER — Other Ambulatory Visit: Payer: 59

## 2020-10-04 DIAGNOSIS — H52223 Regular astigmatism, bilateral: Secondary | ICD-10-CM | POA: Diagnosis not present

## 2020-10-13 ENCOUNTER — Other Ambulatory Visit: Payer: Self-pay | Admitting: Family Medicine

## 2020-10-15 MED ORDER — NORGESTIMATE-ETH ESTRADIOL 0.25-35 MG-MCG PO TABS
1.0000 | ORAL_TABLET | Freq: Every day | ORAL | 3 refills | Status: DC
Start: 2020-10-15 — End: 2021-07-24

## 2020-10-15 NOTE — Addendum Note (Signed)
Addended by: Johnella Moloney on: 10/15/2020 08:11 AM   Modules accepted: Orders

## 2020-11-27 DIAGNOSIS — Z113 Encounter for screening for infections with a predominantly sexual mode of transmission: Secondary | ICD-10-CM | POA: Diagnosis not present

## 2020-11-27 DIAGNOSIS — Z3041 Encounter for surveillance of contraceptive pills: Secondary | ICD-10-CM | POA: Diagnosis not present

## 2020-11-27 DIAGNOSIS — Z1151 Encounter for screening for human papillomavirus (HPV): Secondary | ICD-10-CM | POA: Diagnosis not present

## 2020-11-27 DIAGNOSIS — Z01419 Encounter for gynecological examination (general) (routine) without abnormal findings: Secondary | ICD-10-CM | POA: Diagnosis not present

## 2020-11-27 DIAGNOSIS — Z1389 Encounter for screening for other disorder: Secondary | ICD-10-CM | POA: Diagnosis not present

## 2020-11-27 DIAGNOSIS — R030 Elevated blood-pressure reading, without diagnosis of hypertension: Secondary | ICD-10-CM | POA: Diagnosis not present

## 2020-11-27 DIAGNOSIS — Z124 Encounter for screening for malignant neoplasm of cervix: Secondary | ICD-10-CM | POA: Diagnosis not present

## 2020-11-27 DIAGNOSIS — Z130 Encounter for screening for diseases of the blood and blood-forming organs and certain disorders involving the immune mechanism: Secondary | ICD-10-CM | POA: Diagnosis not present

## 2021-07-24 ENCOUNTER — Encounter: Payer: Self-pay | Admitting: Family Medicine

## 2021-07-24 ENCOUNTER — Ambulatory Visit (INDEPENDENT_AMBULATORY_CARE_PROVIDER_SITE_OTHER): Payer: 59 | Admitting: Family Medicine

## 2021-07-24 VITALS — BP 120/82 | HR 61 | Temp 98.3°F | Ht 65.5 in

## 2021-07-24 DIAGNOSIS — E559 Vitamin D deficiency, unspecified: Secondary | ICD-10-CM

## 2021-07-24 DIAGNOSIS — Z13220 Encounter for screening for lipoid disorders: Secondary | ICD-10-CM

## 2021-07-24 DIAGNOSIS — E611 Iron deficiency: Secondary | ICD-10-CM

## 2021-07-24 DIAGNOSIS — Z0000 Encounter for general adult medical examination without abnormal findings: Secondary | ICD-10-CM | POA: Diagnosis not present

## 2021-07-24 DIAGNOSIS — E538 Deficiency of other specified B group vitamins: Secondary | ICD-10-CM | POA: Diagnosis not present

## 2021-07-24 DIAGNOSIS — Z113 Encounter for screening for infections with a predominantly sexual mode of transmission: Secondary | ICD-10-CM

## 2021-07-24 DIAGNOSIS — Z131 Encounter for screening for diabetes mellitus: Secondary | ICD-10-CM

## 2021-07-24 LAB — COMPREHENSIVE METABOLIC PANEL
ALT: 10 U/L (ref 0–35)
AST: 20 U/L (ref 0–37)
Albumin: 4.2 g/dL (ref 3.5–5.2)
Alkaline Phosphatase: 97 U/L (ref 39–117)
BUN: 12 mg/dL (ref 6–23)
CO2: 27 mEq/L (ref 19–32)
Calcium: 9.4 mg/dL (ref 8.4–10.5)
Chloride: 102 mEq/L (ref 96–112)
Creatinine, Ser: 1 mg/dL (ref 0.40–1.20)
GFR: 73.41 mL/min (ref >60.00)
Glucose, Bld: 96 mg/dL (ref 70–99)
Potassium: 4 mEq/L (ref 3.5–5.1)
Sodium: 136 mEq/L (ref 135–145)
Total Bilirubin: 0.5 mg/dL (ref 0.2–1.2)
Total Protein: 7.6 g/dL (ref 6.0–8.3)

## 2021-07-24 LAB — CBC WITH DIFFERENTIAL/PLATELET
Basophils Absolute: 0.1 10*3/uL (ref 0.0–0.1)
Basophils Relative: 1.3 % (ref 0.0–3.0)
Eosinophils Absolute: 0.1 10*3/uL (ref 0.0–0.7)
Eosinophils Relative: 1.7 % (ref 0.0–5.0)
HCT: 35.5 % — ABNORMAL LOW (ref 36.0–46.0)
Hemoglobin: 11.6 g/dL — ABNORMAL LOW (ref 12.0–15.0)
Lymphocytes Relative: 24.4 % (ref 12.0–46.0)
Lymphs Abs: 1.6 10*3/uL (ref 0.7–4.0)
MCHC: 32.7 g/dL (ref 30.0–36.0)
MCV: 86.6 fl (ref 78.0–100.0)
Monocytes Absolute: 0.6 10*3/uL (ref 0.1–1.0)
Monocytes Relative: 9.7 % (ref 3.0–12.0)
Neutro Abs: 4.1 10*3/uL (ref 1.4–7.7)
Neutrophils Relative %: 62.9 % (ref 43.0–77.0)
Platelets: 278 10*3/uL (ref 150.0–400.0)
RBC: 4.11 Mil/uL (ref 3.87–5.11)
RDW: 14 % (ref 11.5–15.5)
WBC: 6.6 10*3/uL (ref 4.0–10.5)

## 2021-07-24 LAB — LIPID PANEL
Cholesterol: 146 mg/dL (ref 0–200)
HDL: 56.9 mg/dL (ref >39.00)
LDL Cholesterol: 81 mg/dL (ref 0–99)
NonHDL: 88.66
Total CHOL/HDL Ratio: 3
Triglycerides: 36 mg/dL (ref 0.0–149.0)
VLDL: 7.2 mg/dL (ref 0.0–40.0)

## 2021-07-24 LAB — FOLATE: Folate: 9.5 ng/mL (ref >5.9)

## 2021-07-24 LAB — VITAMIN B12: Vitamin B-12: 260 pg/mL (ref 211–911)

## 2021-07-24 LAB — FERRITIN: Ferritin: 15 ng/mL (ref 10.0–291.0)

## 2021-07-24 LAB — VITAMIN D 25 HYDROXY (VIT D DEFICIENCY, FRACTURES): VITD: 14.44 ng/mL — ABNORMAL LOW (ref 30.00–100.00)

## 2021-07-24 NOTE — Patient Instructions (Signed)
Please check home cuff against work cuff and let me know if your cuff is accurate. If you are regularly getting blood pressures over 130/85 I would consider treatment for you.

## 2021-07-24 NOTE — Progress Notes (Addendum)
Zaynab DALENA GRACI DOB: Jun 17, 1986 Encounter date: 07/24/2021  This is a 35 y.o. female who presents for complete physical   History of present illness/Additional concerns:  Last visit was last year. No specific worries today.   Had pap with Dr. Mindi Slicker in march. Had STD testing at that time and all was good; normal pap. Did change ocp. Periods are sometimes heavier, sometimes normal. Monthly. Last about 5 days. On heavier days changing every 2 hours. Sometimes blood clots.   Exercising regularly - doing HIT group work out 4-5 times/week. Eating has improved some. Energy level and sleep ok.  Has been checking bp at home: last year when here was a little higher. Recently 120-130/70-80's. Did change some of what she was eating (not completely, but did improve).   Has had eye exam in January 2022. Wears glasses.  Sees dentist regularly.   Was vitamin D deficient on last bloodwork. Didn't start supplement.   Past Medical History:  Diagnosis Date   Hypertension    Preeclampsia 2013   Past Surgical History:  Procedure Laterality Date   NO PAST SURGERIES     No Known Allergies Current Meds  Medication Sig   Drospirenone (SLYND PO) Take by mouth daily.   Social History   Tobacco Use   Smoking status: Never   Smokeless tobacco: Never  Substance Use Topics   Alcohol use: No   Family History  Problem Relation Age of Onset   Diabetes Father    Hypertension Father    Hypertension Mother    Healthy Sister    Hypertension Maternal Grandmother    Hypertension Paternal Grandmother    Glaucoma Paternal Grandmother      Review of Systems  Constitutional:  Negative for activity change, appetite change, chills, fatigue, fever and unexpected weight change.  HENT:  Negative for congestion, ear pain, hearing loss, sinus pressure, sinus pain, sore throat and trouble swallowing.   Eyes:  Negative for pain and visual disturbance.  Respiratory:  Negative for cough, chest tightness, shortness  of breath and wheezing.   Cardiovascular:  Negative for chest pain, palpitations and leg swelling.  Gastrointestinal:  Negative for abdominal pain, blood in stool, constipation, diarrhea, nausea and vomiting.  Genitourinary:  Negative for difficulty urinating and menstrual problem.  Musculoskeletal:  Negative for arthralgias and back pain.  Skin:  Negative for rash.  Neurological:  Negative for dizziness, weakness, numbness and headaches.  Hematological:  Negative for adenopathy. Does not bruise/bleed easily.  Psychiatric/Behavioral:  Negative for sleep disturbance and suicidal ideas. The patient is not nervous/anxious.    CBC:  Lab Results  Component Value Date   WBC 10.2 07/16/2020   HGB 12.2 07/16/2020   HGB 11.2 10/25/2011   HCT 37.4 07/16/2020   HCT 36 10/25/2011   MCH 29.0 07/16/2020   MCHC 32.6 07/16/2020   RDW 12.1 07/16/2020   PLT 284 07/16/2020   PLT 333 10/25/2011   MPV 12.1 07/16/2020   CMP: Lab Results  Component Value Date   NA 137 07/16/2020   K 3.9 07/16/2020   CL 103 07/16/2020   CO2 25 07/16/2020   GLUCOSE 75 07/16/2020   BUN 11 07/16/2020   CREATININE 0.87 07/16/2020   GFRAA >90 08/16/2013   CALCIUM 9.4 07/16/2020   PROT 7.0 07/16/2020   BILITOT 0.4 07/16/2020   ALKPHOS 61 07/09/2019   ALT 10 07/16/2020   AST 18 07/16/2020   LIPID: Lab Results  Component Value Date   CHOL 167 07/16/2020  TRIG 56 07/16/2020   HDL 77 07/16/2020   LDLCALC 76 07/16/2020    Objective:  BP 120/82 (BP Location: Right Arm, Patient Position: Sitting, Cuff Size: Large)   Pulse 61   Temp 98.3 F (36.8 C) (Oral)   Ht 5' 5.5" (1.664 m)   LMP 07/19/2021 (Exact Date)   SpO2 98%   BMI 34.40 kg/m       BP Readings from Last 3 Encounters:  07/24/21 120/82  07/16/20 140/82  07/09/19 (!) 140/94   Wt Readings from Last 3 Encounters:  06/26/18 209 lb 14.4 oz (95.2 kg)  06/24/17 182 lb 8 oz (82.8 kg)  06/18/16 196 lb 6.4 oz (89.1 kg)    Physical  Exam Constitutional:      General: She is not in acute distress.    Appearance: She is well-developed.  HENT:     Head: Normocephalic and atraumatic.     Right Ear: External ear normal.     Left Ear: External ear normal.     Mouth/Throat:     Pharynx: No oropharyngeal exudate.  Eyes:     Conjunctiva/sclera: Conjunctivae normal.     Pupils: Pupils are equal, round, and reactive to light.  Neck:     Thyroid: No thyromegaly.  Cardiovascular:     Rate and Rhythm: Normal rate and regular rhythm.     Heart sounds: Normal heart sounds. No murmur heard.   No friction rub. No gallop.  Pulmonary:     Effort: Pulmonary effort is normal.     Breath sounds: Normal breath sounds.  Abdominal:     General: Bowel sounds are normal. There is no distension.     Palpations: Abdomen is soft. There is no mass.     Tenderness: There is no abdominal tenderness. There is no guarding.     Hernia: No hernia is present.  Musculoskeletal:        General: No tenderness or deformity. Normal range of motion.     Cervical back: Normal range of motion and neck supple.  Lymphadenopathy:     Cervical: No cervical adenopathy.  Skin:    General: Skin is warm and dry.     Findings: No rash.  Neurological:     Mental Status: She is alert and oriented to person, place, and time.     Deep Tendon Reflexes: Reflexes normal.     Reflex Scores:      Tricep reflexes are 2+ on the right side and 2+ on the left side.      Bicep reflexes are 2+ on the right side and 2+ on the left side.      Brachioradialis reflexes are 2+ on the right side and 2+ on the left side.      Patellar reflexes are 2+ on the right side and 2+ on the left side. Psychiatric:        Speech: Speech normal.        Behavior: Behavior normal.        Thought Content: Thought content normal.    Assessment/Plan: Health Maintenance Due  Topic Date Due   PAP SMEAR-Modifier  06/26/2021   Health Maintenance reviewed. We will get gyn records.   1.  Preventative health care Keep up with healthy eating and regular exercise.   2. Vitamin D deficiency Discussed supplement/replacement.  - VITAMIN D 25 Hydroxy (Vit-D Deficiency, Fractures); Future  3. Vitamin B12 deficiency Recheck levels. Did not take replacement.  - Vitamin B12; Future - Folate; Future  4. Iron deficiency - CBC with Differential/Platelet; Future - Ferritin; Future  5. Lipid screening - Lipid panel; Future  6. Screening for diabetes mellitus - Comprehensive metabolic panel; Future  Return in about 1 year (around 07/24/2022) for physical exam.  Theodis Shove, MD

## 2021-07-27 ENCOUNTER — Telehealth: Payer: Self-pay

## 2021-07-27 LAB — HIV ANTIBODY (ROUTINE TESTING W REFLEX): HIV 1&2 Ab, 4th Generation: NONREACTIVE

## 2021-07-27 NOTE — Telephone Encounter (Signed)
Patient called back asking for a update and stress the urgency to have labs drawn on 11/23

## 2021-07-27 NOTE — Telephone Encounter (Signed)
Pt is calling checking on the status of the below message.

## 2021-07-27 NOTE — Telephone Encounter (Signed)
She had these drawn already. Can hep C be added for screening? TSH was stable last time; likely doesn't need to be drawn now. A1C not needed due to normal previous and normal glucose.

## 2021-07-27 NOTE — Telephone Encounter (Signed)
Patient called requesting labs for A1C, TSH, Hep C patient stated the orders were not placed when she was here last week for office visit patient would like a call back and schedule lab appt on Wednesday

## 2021-07-28 ENCOUNTER — Other Ambulatory Visit: Payer: 59

## 2021-07-28 DIAGNOSIS — Z1159 Encounter for screening for other viral diseases: Secondary | ICD-10-CM

## 2021-07-28 MED ORDER — VITAMIN D (ERGOCALCIFEROL) 1.25 MG (50000 UNIT) PO CAPS: 50000.0000 [IU] | ORAL_CAPSULE | ORAL | 0 refills | Status: DC

## 2021-07-28 NOTE — Telephone Encounter (Signed)
Lab add-on form request for Hep C testing was faxed to the [facility] lab at [phone].  Patient was informed of this and the message below.  Patient stated she would prefer to have an A1C test due to her family history.  Message sent to PCP.

## 2021-07-28 NOTE — Telephone Encounter (Signed)
Ok.  I have been getting kick backs when A1C used for screening purposes, but if she wants added, please see if lab can also add this using "screening for diabetes" and family history of diabetes.

## 2021-07-28 NOTE — Addendum Note (Signed)
Addended by: Johnella Moloney on: 07/28/2021 08:44 AM   Modules accepted: Orders

## 2021-07-29 ENCOUNTER — Other Ambulatory Visit (INDEPENDENT_AMBULATORY_CARE_PROVIDER_SITE_OTHER): Payer: 59

## 2021-07-29 DIAGNOSIS — Z131 Encounter for screening for diabetes mellitus: Secondary | ICD-10-CM

## 2021-07-29 DIAGNOSIS — Z833 Family history of diabetes mellitus: Secondary | ICD-10-CM

## 2021-07-29 LAB — HEPATITIS C ANTIBODY
Hepatitis C Ab: NONREACTIVE
SIGNAL TO CUT-OFF: 0.02 (ref <1.00)

## 2021-07-29 LAB — HEMOGLOBIN A1C: Hgb A1c MFr Bld: 5.8 % (ref 4.6–6.5)

## 2021-07-29 NOTE — Telephone Encounter (Signed)
Patient called again to follow up on labs being put in. I let patient know that they were working on the labs and Ronnald Collum would give her a call today like previously stated when they spoke last time. Patient expressed that she felt concerned about how hard it was to get lab work done especially when the labs were previously done last year. She contemplated scheduling with a different doctor to get them done but I let her know there would be no guarantee they would send in what she was asking for. She then asked about an appointment with Dr.Koberlein and I let her know I could schedule it, but it would be a couple weeks out. Patient stated she would wait for the call but did mention that she hopes to get the labs done by the end of the day, as everything will be closed for thanksgiving      Good callback number is [phone]    Please advise

## 2021-07-29 NOTE — Telephone Encounter (Signed)
Message sent to the patient via Mychart message.

## 2021-10-10 DIAGNOSIS — H52223 Regular astigmatism, bilateral: Secondary | ICD-10-CM | POA: Diagnosis not present

## 2021-10-17 ENCOUNTER — Other Ambulatory Visit: Payer: Self-pay | Admitting: Family Medicine

## 2021-11-09 ENCOUNTER — Telehealth: Payer: Self-pay | Admitting: Family Medicine

## 2021-11-09 NOTE — Telephone Encounter (Signed)
Ok

## 2021-11-09 NOTE — Telephone Encounter (Signed)
Pt is aware dr Hassan Rowan will be leaving and would like to know if dr banks will accept her as TOC ?

## 2021-11-12 NOTE — Telephone Encounter (Signed)
Lmom for pt to call office

## 2021-11-18 NOTE — Telephone Encounter (Signed)
Pt has been sch for 12-03-2021 ?

## 2021-12-03 ENCOUNTER — Ambulatory Visit: Payer: 59 | Admitting: Family Medicine

## 2021-12-03 ENCOUNTER — Encounter: Payer: Self-pay | Admitting: Family Medicine

## 2021-12-03 VITALS — BP 138/79 | HR 89 | Temp 98.5°F

## 2021-12-03 DIAGNOSIS — E559 Vitamin D deficiency, unspecified: Secondary | ICD-10-CM

## 2021-12-03 DIAGNOSIS — Z8679 Personal history of other diseases of the circulatory system: Secondary | ICD-10-CM

## 2021-12-03 NOTE — Progress Notes (Addendum)
Subjective:    Patient ID: Cristina Murphy, female    DOB: April 21, 1986, 36 y.o.   MRN: [id number]  Chief Complaint  Patient presents with   Establish Care    PCP is moving    HPI Patient is a 36 year old female with pmh sig for vitamin D deficiency and h/o gHTN who was seen today for TOC, previously seen by Dr. Hassan Rowan who is unfortunately leaving the area.  Patient states she is overall healthy.  Recent history of vitamin D deficiency, on ergocalciferol 50,000 IUs weekly.  Patient notes HTN during pregnancy.  Not currently checking BP.  Denies headaches, CP, changes in vision.  Trying to exercise regularly.  Mindful of sodium intake.  Allergies: NKDA  Social history: Pt has 2 kids ages 68 and 42.  Patient denies alcohol, tobacco, drug use.  Family medical history: Mom-HTN Dad-HTN, DM  Past Medical History:  Diagnosis Date   Hypertension    Preeclampsia 2013    No Known Allergies  ROS General: Denies fever, chills, night sweats, changes in weight, changes in appetite HEENT: Denies headaches, ear pain, changes in vision, rhinorrhea, sore throat CV: Denies CP, palpitations, SOB, orthopnea Pulm: Denies SOB, cough, wheezing GI: Denies abdominal pain, nausea, vomiting, diarrhea, constipation GU: Denies dysuria, hematuria, frequency, vaginal discharge Msk: Denies muscle cramps, joint pains Neuro: Denies weakness, numbness, tingling Skin: Denies rashes, bruising Psych: Denies depression, anxiety, hallucinations      Objective:    Blood pressure 138/79, pulse 89, temperature 98.5 F (36.9 C), temperature source Oral, SpO2 100 %.  Patient declined weight.  Gen. Pleasant, well-nourished, in no distress, normal affect   HEENT: [place]/AT, face symmetric, conjunctiva clear, no scleral icterus, PERRLA, EOMI, nares patent without drainage, pharynx without erythema or exudate.  TMs normal bilaterally. Neck: No JVD, no thyromegaly, no carotid bruits Lungs: no accessory muscle use, CTAB, no  wheezes or rales Cardiovascular: RRR, no m/r/g, no peripheral edema Abdomen: BS present, soft Musculoskeletal: No deformities, no cyanosis or clubbing, normal tone Neuro:  A&Ox3, CN II-XII intact, normal gait Skin:  Warm, no lesions/ rash   Wt Readings from Last 3 Encounters:  06/26/18 209 lb 14.4 oz (95.2 kg)  06/24/17 182 lb 8 oz (82.8 kg)  06/18/16 196 lb 6.4 oz (89.1 kg)    Lab Results  Component Value Date   WBC 6.6 07/24/2021   HGB 11.6 (L) 07/24/2021   HCT 35.5 (L) 07/24/2021   PLT 278.0 07/24/2021   GLUCOSE 96 07/24/2021   CHOL 146 07/24/2021   TRIG 36.0 07/24/2021   HDL 56.90 07/24/2021   LDLCALC 81 07/24/2021   ALT 10 07/24/2021   AST 20 07/24/2021   NA 136 07/24/2021   K 4.0 07/24/2021   CL 102 07/24/2021   CREATININE 1.00 07/24/2021   BUN 12 07/24/2021   CO2 27 07/24/2021   TSH 1.02 07/16/2020   HGBA1C 5.8 07/29/2021    Assessment/Plan:  Vitamin D deficiency -Vitamin D 14.44 on 07/24/2021 -Continue ergocalciferol 50,000 IUs weekly -Encouraged to get some sunlight daily  History of high blood pressure -History of gestational HTN and preeclampsia in 2013 -BP initially elevated this visit.  Recheck 138/79 -Lifestyle modifications encouraged -Patient encouraged to check BP at home or at work.  For BP readings consistently greater than 140/90 start medication.  F/u as needed  Abbe Amsterdam, MD

## 2022-03-22 ENCOUNTER — Other Ambulatory Visit: Payer: Self-pay | Admitting: Family Medicine

## 2022-03-25 ENCOUNTER — Encounter: Payer: Self-pay | Admitting: Family Medicine

## 2022-03-31 ENCOUNTER — Telehealth: Payer: 59 | Admitting: Family Medicine

## 2022-06-30 ENCOUNTER — Ambulatory Visit (INDEPENDENT_AMBULATORY_CARE_PROVIDER_SITE_OTHER): Payer: 59

## 2022-06-30 DIAGNOSIS — Z23 Encounter for immunization: Secondary | ICD-10-CM

## 2022-07-02 NOTE — Progress Notes (Signed)
Patient received flu vaccine on 06/30/2022.

## 2022-08-05 ENCOUNTER — Encounter: Payer: Self-pay | Admitting: Family Medicine

## 2022-08-05 ENCOUNTER — Ambulatory Visit (INDEPENDENT_AMBULATORY_CARE_PROVIDER_SITE_OTHER): Payer: 59 | Admitting: Family Medicine

## 2022-08-05 VITALS — BP 142/90 | HR 82 | Temp 98.5°F | Ht 65.5 in

## 2022-08-05 DIAGNOSIS — R002 Palpitations: Secondary | ICD-10-CM

## 2022-08-05 DIAGNOSIS — R030 Elevated blood-pressure reading, without diagnosis of hypertension: Secondary | ICD-10-CM

## 2022-08-05 DIAGNOSIS — E559 Vitamin D deficiency, unspecified: Secondary | ICD-10-CM | POA: Diagnosis not present

## 2022-08-05 DIAGNOSIS — R7303 Prediabetes: Secondary | ICD-10-CM

## 2022-08-05 DIAGNOSIS — Z3041 Encounter for surveillance of contraceptive pills: Secondary | ICD-10-CM | POA: Diagnosis not present

## 2022-08-05 DIAGNOSIS — Z0000 Encounter for general adult medical examination without abnormal findings: Secondary | ICD-10-CM | POA: Diagnosis not present

## 2022-08-05 LAB — CBC WITH DIFFERENTIAL/PLATELET
Basophils Absolute: 0.1 10*3/uL (ref 0.0–0.1)
Basophils Relative: 0.6 % (ref 0.0–3.0)
Eosinophils Absolute: 0.1 10*3/uL (ref 0.0–0.7)
Eosinophils Relative: 1.1 % (ref 0.0–5.0)
HCT: 36.7 % (ref 36.0–46.0)
Hemoglobin: 11.7 g/dL — ABNORMAL LOW (ref 12.0–15.0)
Lymphocytes Relative: 26.8 % (ref 12.0–46.0)
Lymphs Abs: 2.5 10*3/uL (ref 0.7–4.0)
MCHC: 31.8 g/dL (ref 30.0–36.0)
MCV: 85.4 fl (ref 78.0–100.0)
Monocytes Absolute: 0.7 10*3/uL (ref 0.1–1.0)
Monocytes Relative: 8 % (ref 3.0–12.0)
Neutro Abs: 5.9 10*3/uL (ref 1.4–7.7)
Neutrophils Relative %: 63.5 % (ref 43.0–77.0)
Platelets: 340 10*3/uL (ref 150.0–400.0)
RBC: 4.3 Mil/uL (ref 3.87–5.11)
RDW: 13.9 % (ref 11.5–15.5)
WBC: 9.3 10*3/uL (ref 4.0–10.5)

## 2022-08-05 LAB — LIPID PANEL
Cholesterol: 146 mg/dL (ref 0–200)
HDL: 64.6 mg/dL (ref >39.00)
LDL Cholesterol: 75 mg/dL (ref 0–99)
NonHDL: 81.42
Total CHOL/HDL Ratio: 2
Triglycerides: 33 mg/dL (ref 0.0–149.0)
VLDL: 6.6 mg/dL (ref 0.0–40.0)

## 2022-08-05 LAB — COMPREHENSIVE METABOLIC PANEL
ALT: 20 U/L (ref 0–35)
AST: 30 U/L (ref 0–37)
Albumin: 4.2 g/dL (ref 3.5–5.2)
Alkaline Phosphatase: 104 U/L (ref 39–117)
BUN: 11 mg/dL (ref 6–23)
CO2: 29 mEq/L (ref 19–32)
Calcium: 9.1 mg/dL (ref 8.4–10.5)
Chloride: 102 mEq/L (ref 96–112)
Creatinine, Ser: 0.91 mg/dL (ref 0.40–1.20)
GFR: 81.61 mL/min (ref >60.00)
Glucose, Bld: 84 mg/dL (ref 70–99)
Potassium: 4 mEq/L (ref 3.5–5.1)
Sodium: 137 mEq/L (ref 135–145)
Total Bilirubin: 0.5 mg/dL (ref 0.2–1.2)
Total Protein: 7.4 g/dL (ref 6.0–8.3)

## 2022-08-05 LAB — TSH: TSH: 1.08 u[IU]/mL (ref 0.35–5.50)

## 2022-08-05 LAB — VITAMIN D 25 HYDROXY (VIT D DEFICIENCY, FRACTURES): VITD: 17.69 ng/mL — ABNORMAL LOW (ref 30.00–100.00)

## 2022-08-05 LAB — T4, FREE: Free T4: 0.95 ng/dL (ref 0.60–1.60)

## 2022-08-05 LAB — MAGNESIUM: Magnesium: 2 mg/dL (ref 1.5–2.5)

## 2022-08-05 LAB — HEMOGLOBIN A1C: Hgb A1c MFr Bld: 5.7 % (ref 4.6–6.5)

## 2022-08-05 MED ORDER — NORGESTIMATE-ETH ESTRADIOL 0.25-35 MG-MCG PO TABS: 1.0000 | ORAL_TABLET | Freq: Every day | ORAL | 3 refills | Status: DC

## 2022-08-05 NOTE — Patient Instructions (Addendum)
Follow-up in 6-8 weeks for blood pressure.  Continue monitoring her blood pressure at home and keeping a log of the readings to bring with you to clinic.  If you notice elevations consistently greater than 140/90 (either number) please notify the clinic so that we can start medication.

## 2022-08-05 NOTE — Progress Notes (Signed)
Subjective:     Cristina Murphy is a 36 y.o. female and is here for a comprehensive physical exam. The patient reports doing well.  Was checking bp at home, typically controlled <130s/80s.  Pt plans on making changes to diet such as decreasing snacking.  Social History   Socioeconomic History   Marital status: Single    Spouse name: Not on file   Number of children: Not on file   Years of education: Not on file   Highest education level: Not on file  Occupational History   Not on file  Tobacco Use   Smoking status: Never   Smokeless tobacco: Never  Substance and Sexual Activity   Alcohol use: No   Drug use: No   Sexual activity: Never    Birth control/protection: None    Comment: no partners currently  Other Topics Concern   Not on file  Social History Narrative   Work or School: Charity fundraiser - orthopedics at [employer] Situation: lives with two children age 69 and 1 month      Spiritual Beliefs: none      Lifestyle: regular exercise, healthy diet chang ein 2017 --> lost 74lbs!      Social Determinants of Health   Financial Resource Strain: Not on file  Food Insecurity: Not on file  Transportation Needs: Not on file  Physical Activity: Not on file  Stress: Not on file  Social Connections: Not on file  Intimate Partner Violence: Not on file   Health Maintenance  Topic Date Due   PAP SMEAR-Modifier  06/26/2021   COVID-19 Vaccine (3 - 2023-24 season) 08/21/2022 (Originally 05/07/2022)   DTaP/Tdap/Td (10 - Td or Tdap) 06/09/2023   INFLUENZA VACCINE  Completed   HPV VACCINES  Completed   Hepatitis C Screening  Completed   HIV Screening  Completed    The following portions of the patient's history were reviewed and updated as appropriate: allergies, current medications, past family history, past medical history, past social history, past surgical history, and problem list.  Review of Systems Pertinent items noted in HPI and remainder of comprehensive ROS otherwise  negative.   Objective:    BP (!) 142/90 (BP Location: Right Arm, Patient Position: Sitting, Cuff Size: Large)   Pulse 82   Temp 98.5 F (36.9 C) (Oral)   Ht 5' 5.5" (1.664 m)   LMP 07/30/2022 (Exact Date)   SpO2 98%   BMI 34.40 kg/m  General appearance: alert, cooperative, and no distress Head: Normocephalic, without obvious abnormality, atraumatic Eyes: conjunctivae/corneas clear. PERRL, EOM's intact. Fundi benign. Ears: normal TM's and external ear canals both ears Nose: Nares normal. Septum midline. Mucosa normal. No drainage or sinus tenderness. Throat: lips, mucosa, and tongue normal; teeth and gums normal Neck: no adenopathy, no carotid bruit, no JVD, supple, symmetrical, trachea midline, and thyroid not enlarged, symmetric, no tenderness/mass/nodules Lungs: clear to auscultation bilaterally Heart: regular rate and rhythm with 2 skipped beats, S1, S2 normal, no murmur, click, rub or gallop Abdomen: soft, non-tender; bowel sounds normal; no masses,  no organomegaly Extremities: extremities normal, atraumatic, no cyanosis or edema Pulses: 2+ and symmetric Skin: Skin color, texture, turgor normal. No rashes or lesions Lymph nodes: Cervical, supraclavicular, and axillary nodes normal. Neurologic: Alert and oriented X 3, normal strength and tone. Normal symmetric reflexes. Normal coordination and gait    Assessment:    Healthy female exam.     Plan:    Anticipatory guidance given including wearing  seatbelts, smoke detectors in the home, increasing physical activity, increasing p.o. intake of water and vegetables. -labs -pap up to date done 11/27/20 -given handout See After Visit Summary for Counseling Recommendations  Well adult exam  Elevated blood pressure reading in office without diagnosis of hypertension -Lifestyle modifications encouraged including decreasing sodium intake and increasing water intake -Continue monitoring BP at home.  For readings consistently greater  than 140/90 notify clinic so medication can be started. - Plan: CMP, Lipid panel  Prediabetes -Hemoglobin A1c 5.8% on 07/29/2021. - Plan: Lipid panel, Hemoglobin A1c  Vitamin D deficiency  - Plan: Vitamin D, 25-hydroxy  Palpitations  -Asymptomatic -Noted on exam -obtain labs -for increased or noticeable symptoms obtain EKG. -Patient advised to be mindful of caffeine intake - Plan: CBC with Differential/Platelet, CMP, TSH, T4, Free, Magnesium, Lipid panel  Encounter for surveillance of contraceptive pills  - Plan: norgestimate-ethinyl estradiol (ESTARYLLA) 0.25-35 MG-MCG tablet  F/u in 6-8 wks for bp  Abbe Amsterdam, MD

## 2022-08-09 ENCOUNTER — Other Ambulatory Visit: Payer: Self-pay | Admitting: Family Medicine

## 2022-08-09 DIAGNOSIS — E559 Vitamin D deficiency, unspecified: Secondary | ICD-10-CM

## 2022-08-09 MED ORDER — VITAMIN D (ERGOCALCIFEROL) 1.25 MG (50000 UNIT) PO CAPS: 50000.0000 [IU] | ORAL_CAPSULE | ORAL | 0 refills | Status: DC

## 2022-10-14 DIAGNOSIS — H52223 Regular astigmatism, bilateral: Secondary | ICD-10-CM | POA: Diagnosis not present

## 2022-11-09 ENCOUNTER — Other Ambulatory Visit: Payer: Self-pay | Admitting: Family Medicine

## 2022-11-09 DIAGNOSIS — E559 Vitamin D deficiency, unspecified: Secondary | ICD-10-CM

## 2023-02-24 DIAGNOSIS — Z3041 Encounter for surveillance of contraceptive pills: Secondary | ICD-10-CM | POA: Diagnosis not present

## 2023-02-24 DIAGNOSIS — Z202 Contact with and (suspected) exposure to infections with a predominantly sexual mode of transmission: Secondary | ICD-10-CM | POA: Diagnosis not present

## 2023-05-20 ENCOUNTER — Ambulatory Visit (INDEPENDENT_AMBULATORY_CARE_PROVIDER_SITE_OTHER): Payer: Commercial Managed Care - PPO

## 2023-05-20 DIAGNOSIS — Z23 Encounter for immunization: Secondary | ICD-10-CM

## 2023-05-20 NOTE — Progress Notes (Signed)
Per orders of Dr. Salomon Fick , injection of Flu Vaccine  given by Stann Ore. Patient tolerated injection well.

## 2023-07-14 ENCOUNTER — Other Ambulatory Visit: Payer: Self-pay | Admitting: Family Medicine

## 2023-07-14 DIAGNOSIS — Z3041 Encounter for surveillance of contraceptive pills: Secondary | ICD-10-CM

## 2023-07-14 MED ORDER — NORGESTIMATE-ETH ESTRADIOL 0.25-35 MG-MCG PO TABS: 1.0000 | ORAL_TABLET | Freq: Every day | ORAL | 3 refills | Status: DC

## 2023-07-14 NOTE — Addendum Note (Signed)
Addended by: Philipp Deputy A on: 07/14/2023 04:49 PM   Modules accepted: Orders

## 2023-08-08 ENCOUNTER — Encounter: Payer: Self-pay | Admitting: Family Medicine

## 2023-08-11 ENCOUNTER — Encounter: Payer: Self-pay | Admitting: Family Medicine

## 2023-08-11 ENCOUNTER — Ambulatory Visit: Payer: Commercial Managed Care - PPO | Admitting: Family Medicine

## 2023-08-11 VITALS — BP 162/104 | HR 101 | Temp 98.4°F

## 2023-08-11 DIAGNOSIS — Z23 Encounter for immunization: Secondary | ICD-10-CM | POA: Diagnosis not present

## 2023-08-11 DIAGNOSIS — R7303 Prediabetes: Secondary | ICD-10-CM | POA: Diagnosis not present

## 2023-08-11 DIAGNOSIS — E663 Overweight: Secondary | ICD-10-CM | POA: Insufficient documentation

## 2023-08-11 DIAGNOSIS — N390 Urinary tract infection, site not specified: Secondary | ICD-10-CM | POA: Insufficient documentation

## 2023-08-11 DIAGNOSIS — E669 Obesity, unspecified: Secondary | ICD-10-CM | POA: Diagnosis not present

## 2023-08-11 DIAGNOSIS — E559 Vitamin D deficiency, unspecified: Secondary | ICD-10-CM | POA: Diagnosis not present

## 2023-08-11 DIAGNOSIS — Z3041 Encounter for surveillance of contraceptive pills: Secondary | ICD-10-CM

## 2023-08-11 DIAGNOSIS — Z0001 Encounter for general adult medical examination with abnormal findings: Secondary | ICD-10-CM | POA: Diagnosis not present

## 2023-08-11 DIAGNOSIS — Z113 Encounter for screening for infections with a predominantly sexual mode of transmission: Secondary | ICD-10-CM

## 2023-08-11 DIAGNOSIS — R87619 Unspecified abnormal cytological findings in specimens from cervix uteri: Secondary | ICD-10-CM | POA: Insufficient documentation

## 2023-08-11 DIAGNOSIS — Z0000 Encounter for general adult medical examination without abnormal findings: Secondary | ICD-10-CM

## 2023-08-11 DIAGNOSIS — I10 Essential (primary) hypertension: Secondary | ICD-10-CM

## 2023-08-11 MED ORDER — NORGESTIMATE-ETH ESTRADIOL 0.25-35 MG-MCG PO TABS: 1.0000 | ORAL_TABLET | Freq: Every day | ORAL | 1 refills | Status: DC

## 2023-08-11 MED ORDER — NIFEDIPINE ER OSMOTIC RELEASE 30 MG PO TB24: 30.0000 mg | ORAL_TABLET | Freq: Every day | ORAL | 3 refills | Status: DC

## 2023-08-11 NOTE — Progress Notes (Signed)
Established Patient Office Visit   Subjective  Patient ID: EDDIE KOC, female    DOB: 16-Sep-1985  Age: 37 y.o. MRN: [id number]  Chief Complaint  Patient presents with   Annual Exam    Patient is a 37 year old female seen for CPE.  Patient states she has been doing well overall.  Patient working on lifestyle modifications to lose weight.  Declines being weighed this visit.  States has not weighed herself in years.  In the past lost 100 pounds naturally through diet and exercise.  Patient notes being on blood pressure medicine, Procardia while pregnant due to previous.  States was able to get off medicine shortly after.  Patient requesting refill on OCPs and HIV testing.    Patient Active Problem List   Diagnosis Date Noted   Abnormal Pap smear of cervix 08/11/2023   Morbid obesity (HCC) 08/11/2023   Overweight 08/11/2023   Urinary tract infectious disease 08/11/2023   Contraceptive use 11/01/2014   Pre-eclampsia 05/26/2012   Past Medical History:  Diagnosis Date   Hypertension    Preeclampsia 2013   Past Surgical History:  Procedure Laterality Date   NO PAST SURGERIES     Social History   Tobacco Use   Smoking status: Never   Smokeless tobacco: Never  Substance Use Topics   Alcohol use: No   Drug use: No   Family History  Problem Relation Age of Onset   Diabetes Father    Hypertension Father    Hypertension Mother    Healthy Sister    Hypertension Maternal Grandmother    Hypertension Paternal Grandmother    Glaucoma Paternal Grandmother    No Known Allergies    ROS Negative unless stated above    Objective:     BP (!) 160/100 (BP Location: Left Arm, Patient Position: Sitting, Cuff Size: Large)   Pulse (!) 101   Temp 98.4 F (36.9 C) (Oral)   LMP 07/18/2023 (Approximate)   SpO2 97%  BP Readings from Last 3 Encounters:  08/11/23 (!) 160/100  08/05/22 (!) 142/90  12/03/21 138/79   Wt Readings from Last 3 Encounters:  06/26/18 209 lb 14.4 oz  (95.2 kg)  06/24/17 182 lb 8 oz (82.8 kg)  06/18/16 196 lb 6.4 oz (89.1 kg)      Physical Exam Constitutional:      Appearance: Normal appearance.  HENT:     Head: Normocephalic and atraumatic.     Right Ear: Tympanic membrane, ear canal and external ear normal.     Left Ear: Tympanic membrane, ear canal and external ear normal.     Nose: Nose normal.     Mouth/Throat:     Mouth: Mucous membranes are moist.     Pharynx: No oropharyngeal exudate or posterior oropharyngeal erythema.  Eyes:     General: No scleral icterus.    Extraocular Movements: Extraocular movements intact.     Conjunctiva/sclera: Conjunctivae normal.     Pupils: Pupils are equal, round, and reactive to light.  Neck:     Thyroid: No thyromegaly.  Cardiovascular:     Rate and Rhythm: Normal rate and regular rhythm.     Pulses: Normal pulses.     Heart sounds: Normal heart sounds. No murmur heard.    No friction rub.  Pulmonary:     Effort: Pulmonary effort is normal.     Breath sounds: Normal breath sounds. No wheezing, rhonchi or rales.  Abdominal:     General: Bowel sounds are normal.  Palpations: Abdomen is soft.     Tenderness: There is no abdominal tenderness.  Musculoskeletal:        General: No deformity. Normal range of motion.  Lymphadenopathy:     Cervical: No cervical adenopathy.  Skin:    General: Skin is warm and dry.     Findings: No lesion.  Neurological:     General: No focal deficit present.     Mental Status: She is alert and oriented to person, place, and time.  Psychiatric:        Mood and Affect: Mood normal.        Thought Content: Thought content normal.       08/11/2023    3:43 PM 08/05/2022    2:11 PM 12/03/2021    2:09 PM  Depression screen PHQ 2/9  Decreased Interest 0 0 0  Down, Depressed, Hopeless 0 0 0  PHQ - 2 Score 0 0 0  Altered sleeping 0 0 0  Tired, decreased energy 0 0 0  Change in appetite 0 0 0  Feeling bad or failure about yourself  0 0 0  Trouble  concentrating 0 0 0  Moving slowly or fidgety/restless 0 0 0  Suicidal thoughts 0 0 0  PHQ-9 Score 0 0 0  Difficult doing work/chores   Not difficult at all      08/11/2023    3:43 PM  GAD 7 : Generalized Anxiety Score  Nervous, Anxious, on Edge 0  Control/stop worrying 0  Worry too much - different things 0  Trouble relaxing 0  Restless 0  Easily annoyed or irritable 0  Afraid - awful might happen 0  Total GAD 7 Score 0      No results found for any visits on 08/11/23.    Assessment & Plan:  Well adult exam -Age-appropriate health screenings discussed -Obtain labs -Pap done. 11/27/2020 with [facility]. -Immunizations reviewed.  Tdap given this visit. -Next CPE in 1 year  Essential hypertension -Uncontrolled -Given continued elevation in BP discussed starting medication. -Will start Procardia XL 30 mg daily as patient had success with medication while pregnant. -Lifestyle modifications encouraged -Patient to monitor BP at home -Follow-up in 1 month -     CBC with Differential/Platelet; Future -     Comprehensive metabolic panel; Future -     TSH; Future -     T4, free; Future -     NIFEdipine ER Osmotic Release; Take 1 tablet (30 mg total) by mouth daily.  Dispense: 30 tablet; Refill: 3  Prediabetes -Hemoglobin A1c 5.7% on 08/05/2022 -     Hemoglobin A1c; Future  Obesity with serious comorbidity, unspecified class, unspecified obesity type  -Patient declines weight this visit.  Advised difficult to help with weight loss without knowing it.  Discussed various ways discussed various ways -     Hemoglobin A1c; Future -     Lipid panel; Future -     VITAMIN D 25 Hydroxy (Vit-D Deficiency, Fractures); Future  Encounter for surveillance of contraceptive pills -     Norgestimate-Eth Estradiol; Take 1 tablet by mouth daily.  Dispense: 90 tablet; Refill: 1  Need for Tdap vaccination -     Tdap vaccine greater than or equal to 7yo IM  Vitamin D deficiency -      VITAMIN D 25 Hydroxy (Vit-D Deficiency, Fractures); Future  Routine screening for STI (sexually transmitted infection) -Offered additional STI screening.  Patient declines at this time. -     HIV  Antibody (routine testing w rflx)   Return in about 5 weeks (around 09/15/2023) for blood pressure.   Deeann Saint, MD

## 2023-08-12 LAB — COMPREHENSIVE METABOLIC PANEL
ALT: 9 U/L (ref 0–35)
AST: 17 U/L (ref 0–37)
Albumin: 4.2 g/dL (ref 3.5–5.2)
Alkaline Phosphatase: 94 U/L (ref 39–117)
BUN: 12 mg/dL (ref 6–23)
CO2: 28 meq/L (ref 19–32)
Calcium: 9.1 mg/dL (ref 8.4–10.5)
Chloride: 103 meq/L (ref 96–112)
Creatinine, Ser: 0.87 mg/dL (ref 0.40–1.20)
GFR: 85.52 mL/min (ref >60.00)
Glucose, Bld: 83 mg/dL (ref 70–99)
Potassium: 4 meq/L (ref 3.5–5.1)
Sodium: 139 meq/L (ref 135–145)
Total Bilirubin: 0.5 mg/dL (ref 0.2–1.2)
Total Protein: 7.3 g/dL (ref 6.0–8.3)

## 2023-08-12 LAB — TSH: TSH: 1.18 u[IU]/mL (ref 0.35–5.50)

## 2023-08-12 LAB — CBC WITH DIFFERENTIAL/PLATELET
Basophils Absolute: 0.1 10*3/uL (ref 0.0–0.1)
Basophils Relative: 1 % (ref 0.0–3.0)
Eosinophils Absolute: 0.1 10*3/uL (ref 0.0–0.7)
Eosinophils Relative: 0.5 % (ref 0.0–5.0)
HCT: 32.5 % — ABNORMAL LOW (ref 36.0–46.0)
Hemoglobin: 11 g/dL — ABNORMAL LOW (ref 12.0–15.0)
Lymphocytes Relative: 22.7 % (ref 12.0–46.0)
Lymphs Abs: 2.2 10*3/uL (ref 0.7–4.0)
MCHC: 34 g/dL (ref 30.0–36.0)
MCV: 82 fL (ref 78.0–100.0)
Monocytes Absolute: 0.8 10*3/uL (ref 0.1–1.0)
Monocytes Relative: 8.3 % (ref 3.0–12.0)
Neutro Abs: 6.4 10*3/uL (ref 1.4–7.7)
Neutrophils Relative %: 67.5 % (ref 43.0–77.0)
Platelets: 409 10*3/uL — ABNORMAL HIGH (ref 150.0–400.0)
RBC: 3.97 Mil/uL (ref 3.87–5.11)
RDW: 15.3 % (ref 11.5–15.5)
WBC: 9.5 10*3/uL (ref 4.0–10.5)

## 2023-08-12 LAB — LIPID PANEL
Cholesterol: 154 mg/dL (ref 0–200)
HDL: 54.9 mg/dL (ref >39.00)
LDL Cholesterol: 94 mg/dL (ref 0–99)
NonHDL: 99.11
Total CHOL/HDL Ratio: 3
Triglycerides: 27 mg/dL (ref 0.0–149.0)
VLDL: 5.4 mg/dL (ref 0.0–40.0)

## 2023-08-12 LAB — VITAMIN D 25 HYDROXY (VIT D DEFICIENCY, FRACTURES): VITD: 11.88 ng/mL — ABNORMAL LOW (ref 30.00–100.00)

## 2023-08-12 LAB — HEMOGLOBIN A1C: Hgb A1c MFr Bld: 5.8 % (ref 4.6–6.5)

## 2023-08-12 LAB — HIV ANTIBODY (ROUTINE TESTING W REFLEX): HIV 1&2 Ab, 4th Generation: NONREACTIVE

## 2023-08-12 LAB — T4, FREE: Free T4: 1.06 ng/dL (ref 0.60–1.60)

## 2023-08-15 ENCOUNTER — Encounter: Payer: Self-pay | Admitting: Family Medicine

## 2023-08-15 ENCOUNTER — Other Ambulatory Visit: Payer: Self-pay | Admitting: Family Medicine

## 2023-08-15 DIAGNOSIS — E559 Vitamin D deficiency, unspecified: Secondary | ICD-10-CM

## 2023-08-15 MED ORDER — VITAMIN D (ERGOCALCIFEROL) 1.25 MG (50000 UNIT) PO CAPS: 50000.0000 [IU] | ORAL_CAPSULE | ORAL | 0 refills | Status: DC

## 2023-09-15 ENCOUNTER — Ambulatory Visit: Payer: Commercial Managed Care - PPO | Admitting: Family Medicine

## 2023-09-15 ENCOUNTER — Other Ambulatory Visit (HOSPITAL_COMMUNITY): Payer: Self-pay

## 2023-09-15 ENCOUNTER — Encounter: Payer: Self-pay | Admitting: Family Medicine

## 2023-09-15 ENCOUNTER — Other Ambulatory Visit (HOSPITAL_BASED_OUTPATIENT_CLINIC_OR_DEPARTMENT_OTHER): Payer: Self-pay

## 2023-09-15 VITALS — BP 150/100 | HR 83 | Temp 98.4°F | Ht 65.5 in

## 2023-09-15 DIAGNOSIS — E559 Vitamin D deficiency, unspecified: Secondary | ICD-10-CM | POA: Diagnosis not present

## 2023-09-15 DIAGNOSIS — I10 Essential (primary) hypertension: Secondary | ICD-10-CM | POA: Diagnosis not present

## 2023-09-15 DIAGNOSIS — Z3041 Encounter for surveillance of contraceptive pills: Secondary | ICD-10-CM | POA: Diagnosis not present

## 2023-09-15 MED ORDER — VITAMIN D (ERGOCALCIFEROL) 1.25 MG (50000 UNIT) PO CAPS
50000.0000 [IU] | ORAL_CAPSULE | ORAL | 0 refills | Status: DC
  Filled 2023-09-15 (×2): qty 8, 56d supply, fill #0
  Filled 2023-09-15: qty 4, 28d supply, fill #0

## 2023-09-15 MED ORDER — NIFEDIPINE ER OSMOTIC RELEASE 60 MG PO TB24
60.0000 mg | ORAL_TABLET | Freq: Every day | ORAL | 3 refills | Status: DC
  Filled 2023-09-15 (×2): qty 90, 90d supply, fill #0

## 2023-09-15 MED ORDER — NORGESTIMATE-ETH ESTRADIOL 0.25-35 MG-MCG PO TABS
1.0000 | ORAL_TABLET | Freq: Every day | ORAL | 3 refills | Status: DC
  Filled 2023-09-15 – 2023-09-28 (×3): qty 84, 84d supply, fill #0
  Filled 2023-12-22: qty 84, 84d supply, fill #1
  Filled 2024-03-15: qty 84, 84d supply, fill #2
  Filled 2024-06-07: qty 84, 84d supply, fill #3

## 2023-09-15 NOTE — Progress Notes (Signed)
 Established Patient Office Visit   Subjective  Patient ID: Cristina Murphy, female    DOB: [birth year]  Age: 38 y.o. MRN: [id number]  Chief Complaint  Patient presents with   Medical Management of Chronic Issues    5 week follow-up Bp check,     Patient is a 38 year old female seen for follow-up on BP.  Pt had issues with obtaining rxs from [facility]. Had to switch to [facility].  Out of Procardia  XL 30 mg x 1 week.  Was able to get 1 month supply of Procardia  prior to running out.  BPs at home 135-140s and occasional 150/ 80s-90s.  Pt working on lifestyle modifications over the last week.  Denies headaches, CP, LE edema, changes in vision.  Patient was able to pick up prescription for OCPs.  States given a different brand of pills than she normally gets.  Patient also had issues with ergocalciferol  prescription.  Currently given 1 month supply and set up for 12 weeks.     Patient Active Problem List   Diagnosis Date Noted   Abnormal Pap smear of cervix 08/11/2023   Morbid obesity (HCC) 08/11/2023   Overweight 08/11/2023   Urinary tract infectious disease 08/11/2023   Contraceptive use 11/01/2014   Pre-eclampsia 05/26/2012   Past Medical History:  Diagnosis Date   Hypertension    Preeclampsia 2013   Past Surgical History:  Procedure Laterality Date   NO PAST SURGERIES     Social History   Tobacco Use   Smoking status: Never   Smokeless tobacco: Never  Substance Use Topics   Alcohol use: No   Drug use: No   Family History  Problem Relation Age of Onset   Diabetes Father    Hypertension Father    Hypertension Mother    Healthy Sister    Hypertension Maternal Grandmother    Hypertension Paternal Grandmother    Glaucoma Paternal Grandmother    No Known Allergies    ROS Negative unless stated above    Objective:     BP (!) 150/100 (BP Location: Left Arm, Patient Position: Sitting, Cuff Size: Large)   Pulse 83   Temp 98.4 F (36.9 C)  (Oral)   Ht 5' 5.5 (1.664 m)   LMP 09/09/2023   SpO2 99%   BMI 34.40 kg/m    Physical Exam Constitutional:      Appearance: Normal appearance.  HENT:     Head: Normocephalic and atraumatic.     Nose: Nose normal.     Mouth/Throat:     Mouth: Mucous membranes are moist.  Cardiovascular:     Rate and Rhythm: Normal rate.  Pulmonary:     Effort: Pulmonary effort is normal.  Skin:    General: Skin is warm.  Neurological:     Mental Status: She is alert and oriented to person, place, and time.     No results found for any visits on 09/15/23.    Assessment & Plan:  Essential hypertension -Uncontrolled -Increase Procardia  XL from 30 mg to 60 mg daily -Continue lifestyle modifications -Patient to monitor BP at home and notify clinic for elevations consistently greater than 140/90. -     NIFEdipine  ER Osmotic Release; Take 1 tablet (60 mg total) by mouth daily.  Dispense: 90 tablet; Refill: 3  Vitamin D  deficiency -Vitamin D  11.88 on 08/11/2023 -Patient to complete course of ergocalciferol  50,000 IUs weekly x 12 weeks -     Vitamin D  (Ergocalciferol ); Take 1 capsule (50,000 Units  total) by mouth every 7 (seven) days.  Dispense: 8 capsule; Refill: 0  Encounter for surveillance of contraceptive pills -     Norgestimate -Eth Estradiol ; Take 1 tablet by mouth daily.  Dispense: 84 tablet; Refill: 3   Return in about 6 weeks (around 10/27/2023).   [name], MD

## 2023-09-22 ENCOUNTER — Other Ambulatory Visit (HOSPITAL_BASED_OUTPATIENT_CLINIC_OR_DEPARTMENT_OTHER): Payer: Self-pay

## 2023-09-28 ENCOUNTER — Other Ambulatory Visit: Payer: Self-pay

## 2023-09-28 ENCOUNTER — Other Ambulatory Visit (HOSPITAL_BASED_OUTPATIENT_CLINIC_OR_DEPARTMENT_OTHER): Payer: Self-pay

## 2023-10-20 DIAGNOSIS — H52223 Regular astigmatism, bilateral: Secondary | ICD-10-CM | POA: Diagnosis not present

## 2023-10-27 ENCOUNTER — Encounter: Payer: Self-pay | Admitting: Family Medicine

## 2023-10-27 ENCOUNTER — Ambulatory Visit: Payer: Commercial Managed Care - PPO | Admitting: Family Medicine

## 2023-10-27 ENCOUNTER — Other Ambulatory Visit (HOSPITAL_BASED_OUTPATIENT_CLINIC_OR_DEPARTMENT_OTHER): Payer: Self-pay

## 2023-10-27 VITALS — BP 132/90 | HR 88 | Temp 98.7°F | Ht 65.5 in

## 2023-10-27 DIAGNOSIS — I10 Essential (primary) hypertension: Secondary | ICD-10-CM

## 2023-10-27 DIAGNOSIS — R635 Abnormal weight gain: Secondary | ICD-10-CM

## 2023-10-27 MED ORDER — NIFEDIPINE ER OSMOTIC RELEASE 90 MG PO TB24
90.0000 mg | ORAL_TABLET | Freq: Every day | ORAL | 1 refills | Status: DC
  Filled 2023-10-27: qty 90, 90d supply, fill #0

## 2023-10-27 NOTE — Progress Notes (Signed)
 Established Patient Office Visit   Subjective  Patient ID: Cristina Murphy, female    DOB: 1986/05/11  Age: 38 y.o. MRN: [id number]  Chief Complaint  Patient presents with   Medical Management of Chronic Issues    6 week follow-up, BP Vit -D and Birth control,     Patient is a 38 year old female seen for follow-up.  BP uncontrolled at last OFV.  Nifedipine XL increased to 60 mg daily from 30 mg.  BP at home 130s-140s/90s.  Patient was being consistent with diet changes in January but less so in February.  Patient denies issues with OCPs, was able to get 33-month supply at a time.  Was also able to get the rest of the prescription for vitamin D.  Patient inquires about weight loss medication phentermine.  Patient does not weigh herself as afraid will obsess over the number.    Patient Active Problem List   Diagnosis Date Noted   Abnormal Pap smear of cervix 08/11/2023   Morbid obesity (HCC) 08/11/2023   Overweight 08/11/2023   Urinary tract infectious disease 08/11/2023   Contraceptive use 11/01/2014   Pre-eclampsia 05/26/2012   Past Medical History:  Diagnosis Date   Hypertension    Preeclampsia 2013   Past Surgical History:  Procedure Laterality Date   NO PAST SURGERIES     Social History   Tobacco Use   Smoking status: Never   Smokeless tobacco: Never  Substance Use Topics   Alcohol use: No   Drug use: No   Family History  Problem Relation Age of Onset   Diabetes Father    Hypertension Father    Hypertension Mother    Healthy Sister    Hypertension Maternal Grandmother    Hypertension Paternal Grandmother    Glaucoma Paternal Grandmother    No Known Allergies    ROS Negative unless stated above    Objective:     BP (!) 132/90 (BP Location: Left Arm, Patient Position: Sitting, Cuff Size: Large)   Pulse 88   Temp 98.7 F (37.1 C) (Oral)   Ht 5' 5.5" (1.664 m)   LMP 10/11/2023 (Approximate)   SpO2 96%   BMI 34.40 kg/m weight declined. BP Readings  from Last 3 Encounters:  10/27/23 (!) 132/90  09/15/23 (!) 150/100  08/11/23 (!) 162/104   Wt Readings from Last 3 Encounters:  06/26/18 209 lb 14.4 oz (95.2 kg)  06/24/17 182 lb 8 oz (82.8 kg)  06/18/16 196 lb 6.4 oz (89.1 kg)      Physical Exam Constitutional:      General: She is not in acute distress.    Appearance: Normal appearance.  HENT:     Head: Normocephalic and atraumatic.     Nose: Nose normal.     Mouth/Throat:     Mouth: Mucous membranes are moist.  Cardiovascular:     Rate and Rhythm: Normal rate and regular rhythm.     Heart sounds: Normal heart sounds. No murmur heard.    No gallop.  Pulmonary:     Effort: Pulmonary effort is normal. No respiratory distress.     Breath sounds: Normal breath sounds. No wheezing, rhonchi or rales.  Skin:    General: Skin is warm and dry.  Neurological:     Mental Status: She is alert and oriented to person, place, and time.    No results found for any visits on 10/27/23.    Assessment & Plan:  Essential hypertension -     NIFEdipine  ER Osmotic Release; Take 1 tablet (90 mg total) by mouth daily.  Dispense: 90 tablet; Refill: 1  Weight gain  BP remains uncontrolled.  Increase Procardia XL from 60 to 90 mg daily.  Continue monitoring BP at home.  Discussed the importance of lifestyle modifications.  Discussed the importance of BP control.  Discussed need to obtain weight if interested in weight loss medications.  Last weight 210 lbs in 2019.  Cautioned as phentermine may cause elevated BP.  Again reiterated the importance of lifestyle modifications.  Consider referral to weight management.  Follow-up in the next few months, sooner if needed.  Return in about 2 months (around 12/25/2023) for blood pressure.   Deeann Saint, MD

## 2023-10-28 ENCOUNTER — Other Ambulatory Visit (HOSPITAL_BASED_OUTPATIENT_CLINIC_OR_DEPARTMENT_OTHER): Payer: Self-pay

## 2023-10-29 ENCOUNTER — Other Ambulatory Visit (HOSPITAL_BASED_OUTPATIENT_CLINIC_OR_DEPARTMENT_OTHER): Payer: Self-pay

## 2023-11-03 ENCOUNTER — Other Ambulatory Visit: Payer: Self-pay | Admitting: Family Medicine

## 2023-11-03 DIAGNOSIS — E559 Vitamin D deficiency, unspecified: Secondary | ICD-10-CM

## 2023-11-08 ENCOUNTER — Encounter (HOSPITAL_BASED_OUTPATIENT_CLINIC_OR_DEPARTMENT_OTHER): Payer: Self-pay

## 2023-11-08 ENCOUNTER — Other Ambulatory Visit (HOSPITAL_BASED_OUTPATIENT_CLINIC_OR_DEPARTMENT_OTHER): Payer: Self-pay

## 2023-12-22 ENCOUNTER — Encounter: Payer: Self-pay | Admitting: Family Medicine

## 2023-12-22 ENCOUNTER — Other Ambulatory Visit (HOSPITAL_BASED_OUTPATIENT_CLINIC_OR_DEPARTMENT_OTHER): Payer: Self-pay

## 2023-12-22 ENCOUNTER — Ambulatory Visit: Payer: Commercial Managed Care - PPO | Admitting: Family Medicine

## 2023-12-22 VITALS — BP 136/74 | HR 90 | Temp 98.4°F | Ht 65.5 in

## 2023-12-22 DIAGNOSIS — I10 Essential (primary) hypertension: Secondary | ICD-10-CM

## 2023-12-22 MED ORDER — NIFEDIPINE ER OSMOTIC RELEASE 90 MG PO TB24
90.0000 mg | ORAL_TABLET | Freq: Every day | ORAL | 2 refills | Status: DC
  Filled 2023-12-22 – 2024-01-09 (×3): qty 90, 90d supply, fill #0
  Filled 2024-04-19: qty 90, 90d supply, fill #1

## 2023-12-22 NOTE — Progress Notes (Signed)
   Established Patient Office Visit   Subjective  Patient ID: Cristina Murphy, female    DOB: [birth year]  Age: 38 y.o. MRN: [id number]  Chief Complaint  Patient presents with   Medical Management of Chronic Issues    Patient is a 38 year old female seen for follow-up on HTN.  Patient taking Procardia XL 90 mg daily.  States BP at home in the 130s/70-80s.  May occasionally see in 140 systolic BP but not often.  Patient endorses doing cardio several days per week.  Working on water intake.  Denies headaches, CP, changes in vision.    Patient Active Problem List   Diagnosis Date Noted   Abnormal Pap smear of cervix 08/11/2023   Morbid obesity (HCC) 08/11/2023   Overweight 08/11/2023   Urinary tract infectious disease 08/11/2023   Contraceptive use 11/01/2014   Pre-eclampsia 05/26/2012   Past Medical History:  Diagnosis Date   Hypertension    Preeclampsia 2013   Past Surgical History:  Procedure Laterality Date   NO PAST SURGERIES     Social History   Tobacco Use   Smoking status: Never   Smokeless tobacco: Never  Substance Use Topics   Alcohol use: No   Drug use: No   Family History  Problem Relation Age of Onset   Diabetes Father    Hypertension Father    Hypertension Mother    Healthy Sister    Hypertension Maternal Grandmother    Hypertension Paternal Grandmother    Glaucoma Paternal Grandmother    No Known Allergies    ROS Negative unless stated above    Objective:     BP 136/74 (BP Location: Left Arm, Patient Position: Sitting, Cuff Size: Large)   Pulse 90   Temp 98.4 F (36.9 C) (Oral)   Ht 5' 5.5" (1.664 m)   SpO2 98%   BMI 34.40 kg/m weight declined by patient BP Readings from Last 3 Encounters:  12/22/23 136/74  10/27/23 (!) 132/90  09/15/23 (!) 150/100   Wt Readings from Last 3 Encounters:  06/26/18 209 lb 14.4 oz (95.2 kg)  06/24/17 182 lb 8 oz (82.8 kg)  06/18/16 196 lb 6.4 oz (89.1 kg)    Physical Exam Constitutional:       General: She is not in acute distress.    Appearance: Normal appearance.  HENT:     Head: Normocephalic and atraumatic.     Nose: Nose normal.     Mouth/Throat:     Mouth: Mucous membranes are moist.  Cardiovascular:     Rate and Rhythm: Normal rate and regular rhythm.     Heart sounds: Normal heart sounds. No murmur heard.    No gallop.  Pulmonary:     Effort: Pulmonary effort is normal. No respiratory distress.     Breath sounds: Normal breath sounds. No wheezing, rhonchi or rales.  Skin:    General: Skin is warm and dry.  Neurological:     Mental Status: She is alert and oriented to person, place, and time.      No results found for any visits on 12/22/23.    Assessment & Plan:  Essential hypertension -     NIFEdipine ER Osmotic Release; Take 1 tablet (90 mg total) by mouth daily.  Dispense: 90 tablet; Refill: 2  BP controlled.  Continue Procardia XL 90 mg daily.  Continue lifestyle modifications.  Return for blood pressure follow-up in 4-6 months, sooner if needed.Deeann Saint, MD

## 2023-12-23 ENCOUNTER — Other Ambulatory Visit (HOSPITAL_BASED_OUTPATIENT_CLINIC_OR_DEPARTMENT_OTHER): Payer: Self-pay

## 2023-12-26 ENCOUNTER — Other Ambulatory Visit (HOSPITAL_BASED_OUTPATIENT_CLINIC_OR_DEPARTMENT_OTHER): Payer: Self-pay

## 2024-01-06 ENCOUNTER — Other Ambulatory Visit: Payer: Self-pay

## 2024-01-09 ENCOUNTER — Other Ambulatory Visit (HOSPITAL_BASED_OUTPATIENT_CLINIC_OR_DEPARTMENT_OTHER): Payer: Self-pay

## 2024-03-22 ENCOUNTER — Other Ambulatory Visit (HOSPITAL_BASED_OUTPATIENT_CLINIC_OR_DEPARTMENT_OTHER): Payer: Self-pay

## 2024-05-25 ENCOUNTER — Ambulatory Visit

## 2024-06-07 ENCOUNTER — Ambulatory Visit: Admitting: Family Medicine

## 2024-06-07 ENCOUNTER — Other Ambulatory Visit (HOSPITAL_BASED_OUTPATIENT_CLINIC_OR_DEPARTMENT_OTHER): Payer: Self-pay

## 2024-06-07 ENCOUNTER — Other Ambulatory Visit: Payer: Self-pay

## 2024-06-07 ENCOUNTER — Encounter: Payer: Self-pay | Admitting: Family Medicine

## 2024-06-07 VITALS — BP 132/90 | HR 88 | Temp 97.9°F | Ht 65.5 in

## 2024-06-07 DIAGNOSIS — I10 Essential (primary) hypertension: Secondary | ICD-10-CM

## 2024-06-07 DIAGNOSIS — Z113 Encounter for screening for infections with a predominantly sexual mode of transmission: Secondary | ICD-10-CM

## 2024-06-07 DIAGNOSIS — Z3041 Encounter for surveillance of contraceptive pills: Secondary | ICD-10-CM | POA: Diagnosis not present

## 2024-06-07 MED ORDER — NIFEDIPINE ER OSMOTIC RELEASE 90 MG PO TB24
90.0000 mg | ORAL_TABLET | Freq: Every day | ORAL | 3 refills | Status: AC
  Filled 2024-06-07 – 2024-10-10 (×4): qty 90, 90d supply, fill #0

## 2024-06-07 MED ORDER — NORGESTIMATE-ETH ESTRADIOL 0.25-35 MG-MCG PO TABS
1.0000 | ORAL_TABLET | Freq: Every day | ORAL | 4 refills | Status: AC
  Filled 2024-06-19 (×2): qty 84, 84d supply, fill #0
  Filled 2024-08-23 – 2024-08-25 (×2): qty 84, 84d supply, fill #1

## 2024-06-07 NOTE — Progress Notes (Signed)
 Established Patient Office Visit   Subjective  Patient ID: Cristina Murphy, female    DOB: 1986-08-20  Age: 38 y.o. MRN: [id number]  Chief Complaint  Patient presents with   Medical Management of Chronic Issues    6 month follow-up for Blood pressure     Patient is a 38 year old female seen for follow-up on HTN.  Patient taking Procardia  XL 90 mg at night.  States BP at home 130/70-80s.  Rarely higher.  Patient drinking at least 2-3 16.9 oz bottles of water per day.  Getting some exercise and trying to cook at home more.  May eat out 2-3 times per week. Denies HAs, changes in vision, CP.  Pt requesting HIV testing, med refills.    Patient Active Problem List   Diagnosis Date Noted   Abnormal Pap smear of cervix 08/11/2023   Morbid obesity (HCC) 08/11/2023   Overweight 08/11/2023   Urinary tract infectious disease 08/11/2023   Contraceptive use 11/01/2014   Pre-eclampsia 05/26/2012   Past Medical History:  Diagnosis Date   Hypertension    Preeclampsia 2013   Past Surgical History:  Procedure Laterality Date   NO PAST SURGERIES     Social History   Tobacco Use   Smoking status: Never   Smokeless tobacco: Never  Substance Use Topics   Alcohol use: No   Drug use: No   Family History  Problem Relation Age of Onset   Diabetes Father    Hypertension Father    Hypertension Mother    Healthy Sister    Hypertension Maternal Grandmother    Hypertension Paternal Grandmother    Glaucoma Paternal Grandmother    No Known Allergies  ROS Negative unless stated above    Objective:     BP (!) 132/90   Pulse 88   Temp 97.9 F (36.6 C) (Oral)   Ht 5' 5.5 (1.664 m)   SpO2 98%   BMI 34.40 kg/m patient refuses weight BP Readings from Last 3 Encounters:  06/07/24 (!) 136/90  12/22/23 136/74  10/27/23 (!) 132/90   Wt Readings from Last 3 Encounters:  06/26/18 209 lb 14.4 oz (95.2 kg)  06/24/17 182 lb 8 oz (82.8 kg)  06/18/16 196 lb 6.4 oz (89.1 kg)       Physical Exam Constitutional:      General: She is not in acute distress.    Appearance: Normal appearance.  HENT:     Head: Normocephalic and atraumatic.     Nose: Nose normal.     Mouth/Throat:     Mouth: Mucous membranes are moist.  Cardiovascular:     Rate and Rhythm: Normal rate and regular rhythm.     Heart sounds: Normal heart sounds. No murmur heard.    No gallop.  Pulmonary:     Effort: Pulmonary effort is normal. No respiratory distress.     Breath sounds: Normal breath sounds. No wheezing, rhonchi or rales.  Skin:    General: Skin is warm and dry.  Neurological:     Mental Status: She is alert and oriented to person, place, and time.        06/07/2024    9:02 AM 08/11/2023    3:43 PM 08/05/2022    2:11 PM  Depression screen PHQ 2/9  Decreased Interest 0 0 0  Down, Depressed, Hopeless 0 0 0  PHQ - 2 Score 0 0 0  Altered sleeping 0 0 0  Tired, decreased energy 0 0 0  Change in appetite  0 0 0  Feeling bad or failure about yourself  0 0 0  Trouble concentrating 0 0 0  Moving slowly or fidgety/restless 0 0 0  Suicidal thoughts 0 0 0  PHQ-9 Score 0 0 0      06/07/2024    9:02 AM 08/11/2023    3:43 PM  GAD 7 : Generalized Anxiety Score  Nervous, Anxious, on Edge 0 0  Control/stop worrying 0 0  Worry too much - different things 0 0  Trouble relaxing 0 0  Restless 0 0  Easily annoyed or irritable 0 0  Afraid - awful might happen 0 0  Total GAD 7 Score 0 0     No results found for any visits on 06/07/24.    Assessment & Plan:   Essential hypertension -     Comprehensive metabolic panel with GFR; Future -     NIFEdipine  ER Osmotic Release; Take 1 tablet (90 mg total) by mouth daily.  Dispense: 90 tablet; Refill: 3 -     TSH; Future -     T4, free; Future  Routine screening for STI (sexually transmitted infection) -     HIV Antibody (routine testing w rflx); Future  Encounter for surveillance of contraceptive pills -     Norgestimate -Eth  Estradiol ; Take 1 tablet by mouth daily.  Dispense: 84 tablet; Refill: 4   BP elevated.  Recheck similar.  Pt reports better readings at home.  Obtain labs.  Will give 3 months to better implement lifestyle modifications.  For continued bp elevation at next OFV add another antihypertensive agent to regimen.  Continue monitoring at home.  OCPs refilled.  Return in about 3 months (around 09/07/2024) for blood pressure.   [name], MD

## 2024-06-08 LAB — COMPREHENSIVE METABOLIC PANEL WITH GFR
AG Ratio: 1.1 (calc) (ref 1.0–2.5)
ALT: 10 U/L (ref 6–29)
AST: 16 U/L (ref 10–30)
Albumin: 3.9 g/dL (ref 3.6–5.1)
Alkaline phosphatase (APISO): 87 U/L (ref 31–125)
BUN: 9 mg/dL (ref 7–25)
CO2: 27 mmol/L (ref 20–32)
Calcium: 9.3 mg/dL (ref 8.6–10.2)
Chloride: 100 mmol/L (ref 98–110)
Creat: 0.79 mg/dL (ref 0.50–0.97)
Globulin: 3.4 g/dL (ref 1.9–3.7)
Glucose, Bld: 106 mg/dL — ABNORMAL HIGH (ref 65–99)
Potassium: 4.1 mmol/L (ref 3.5–5.3)
Sodium: 137 mmol/L (ref 135–146)
Total Bilirubin: 0.3 mg/dL (ref 0.2–1.2)
Total Protein: 7.3 g/dL (ref 6.1–8.1)
eGFR: 99 mL/min/1.73m2 (ref >=60)

## 2024-06-08 LAB — TSH: TSH: 0.9 m[IU]/L

## 2024-06-08 LAB — T4, FREE: Free T4: 1.3 ng/dL (ref 0.8–1.8)

## 2024-06-08 LAB — HIV ANTIBODY (ROUTINE TESTING W REFLEX)
HIV 1&2 Ab, 4th Generation: NONREACTIVE
HIV FINAL INTERPRETATION: NEGATIVE

## 2024-06-18 ENCOUNTER — Other Ambulatory Visit (HOSPITAL_BASED_OUTPATIENT_CLINIC_OR_DEPARTMENT_OTHER): Payer: Self-pay

## 2024-06-19 ENCOUNTER — Other Ambulatory Visit (HOSPITAL_BASED_OUTPATIENT_CLINIC_OR_DEPARTMENT_OTHER): Payer: Self-pay

## 2024-06-20 ENCOUNTER — Other Ambulatory Visit (HOSPITAL_COMMUNITY): Payer: Self-pay

## 2024-06-22 ENCOUNTER — Ambulatory Visit: Payer: Self-pay | Admitting: Family Medicine

## 2024-07-06 ENCOUNTER — Other Ambulatory Visit (HOSPITAL_BASED_OUTPATIENT_CLINIC_OR_DEPARTMENT_OTHER): Payer: Self-pay

## 2024-07-11 ENCOUNTER — Other Ambulatory Visit (HOSPITAL_BASED_OUTPATIENT_CLINIC_OR_DEPARTMENT_OTHER): Payer: Self-pay

## 2024-08-16 ENCOUNTER — Encounter: Payer: Self-pay | Admitting: Family Medicine

## 2024-08-16 ENCOUNTER — Ambulatory Visit: Admitting: Family Medicine

## 2024-08-16 VITALS — BP 138/100 | HR 85 | Temp 98.1°F | Ht 65.5 in

## 2024-08-16 DIAGNOSIS — E669 Obesity, unspecified: Secondary | ICD-10-CM | POA: Diagnosis not present

## 2024-08-16 DIAGNOSIS — I10 Essential (primary) hypertension: Secondary | ICD-10-CM

## 2024-08-16 DIAGNOSIS — Z113 Encounter for screening for infections with a predominantly sexual mode of transmission: Secondary | ICD-10-CM

## 2024-08-16 DIAGNOSIS — Z0000 Encounter for general adult medical examination without abnormal findings: Secondary | ICD-10-CM

## 2024-08-16 NOTE — Progress Notes (Signed)
 "  Established Patient Office Visit   Subjective  Patient ID: Cristina Murphy, female    DOB: [birth year]  Age: 38 y.o. MRN: [id number]  Chief Complaint  Patient presents with   Annual Exam    Patient is a 38 year old female seen for CPE.  Patient is fasting.  Patient has not been checking BP at home.  States feels fine.  On Procardia  XL 90 mg daily.  States has not made any changes to diet and exercise.  Has Pap scheduled.  Had flu vaccine in September.    Patient Active Problem List   Diagnosis Date Noted   Abnormal Pap smear of cervix 08/11/2023   Morbid obesity (HCC) 08/11/2023   Overweight 08/11/2023   Urinary tract infectious disease 08/11/2023   Contraceptive use 11/01/2014   Pre-eclampsia 05/26/2012   Past Medical History:  Diagnosis Date   Hypertension    Preeclampsia 2013   Past Surgical History:  Procedure Laterality Date   NO PAST SURGERIES     Social History[1] Family History  Problem Relation Age of Onset   Diabetes Father    Hypertension Father    Hypertension Mother    Healthy Sister    Hypertension Maternal Grandmother    Hypertension Paternal Grandmother    Glaucoma Paternal Grandmother    Allergies[2]  ROS Negative unless stated above    Objective:     BP (!) 138/100 (BP Location: Left Arm, Patient Position: Sitting, Cuff Size: Large)   Pulse 85   Temp 98.1 F (36.7 C) (Oral)   Ht 5' 5.5 (1.664 m)   LMP 08/15/2024 (Exact Date)   SpO2 99%   BMI 34.40 kg/m  Declines wt. BP Readings from Last 3 Encounters:  08/16/24 (!) 138/100  06/07/24 (!) 132/90  12/22/23 136/74   Wt Readings from Last 3 Encounters:  06/26/18 209 lb 14.4 oz (95.2 kg)  06/24/17 182 lb 8 oz (82.8 kg)  06/18/16 196 lb 6.4 oz (89.1 kg)      Physical Exam Constitutional:      Appearance: Normal appearance. She is obese.  HENT:     Head: Normocephalic and atraumatic.     Right Ear: Tympanic membrane, ear canal and external ear normal.     Left Ear: Tympanic  membrane, ear canal and external ear normal.     Nose: Nose normal.     Mouth/Throat:     Mouth: Mucous membranes are moist.     Pharynx: No oropharyngeal exudate or posterior oropharyngeal erythema.  Eyes:     General: No scleral icterus.    Extraocular Movements: Extraocular movements intact.     Conjunctiva/sclera: Conjunctivae normal.     Pupils: Pupils are equal, round, and reactive to light.  Neck:     Thyroid: No thyromegaly.     Vascular: No carotid bruit.  Cardiovascular:     Rate and Rhythm: Normal rate and regular rhythm.     Pulses: Normal pulses.     Heart sounds: Normal heart sounds. No murmur heard.    No friction rub.  Pulmonary:     Effort: Pulmonary effort is normal.     Breath sounds: Normal breath sounds. No wheezing, rhonchi or rales.  Abdominal:     General: Bowel sounds are normal.     Palpations: Abdomen is soft.     Tenderness: There is no abdominal tenderness.  Musculoskeletal:        General: No deformity. Normal range of motion.  Lymphadenopathy:     Cervical:  No cervical adenopathy.  Skin:    General: Skin is warm and dry.     Findings: No lesion.  Neurological:     General: No focal deficit present.     Mental Status: She is alert and oriented to person, place, and time.  Psychiatric:        Mood and Affect: Mood normal.        Thought Content: Thought content normal.        08/16/2024    3:09 PM 06/07/2024    9:02 AM 08/11/2023    3:43 PM  Depression screen PHQ 2/9  Decreased Interest 0 0 0  Down, Depressed, Hopeless 0 0 0  PHQ - 2 Score 0 0 0  Altered sleeping 0 0 0  Tired, decreased energy 0 0 0  Change in appetite 0 0 0  Feeling bad or failure about yourself  0 0 0  Trouble concentrating 0 0 0  Moving slowly or fidgety/restless 0 0 0  Suicidal thoughts 0 0 0  PHQ-9 Score 0 0  0      Data saved with a previous flowsheet row definition      08/16/2024    3:09 PM 06/07/2024    9:02 AM 08/11/2023    3:43 PM  GAD 7 :  Generalized Anxiety Score  Nervous, Anxious, on Edge 0 0 0  Control/stop worrying 0 0 0  Worry too much - different things 0 0 0  Trouble relaxing 0 0 0  Restless 0 0 0  Easily annoyed or irritable 0 0 0  Afraid - awful might happen 0 0 0  Total GAD 7 Score 0 0 0     No results found for any visits on 08/16/24.    Assessment & Plan:   Well adult exam -     CBC with Differential/Platelet; Future -     Comprehensive metabolic panel with GFR; Future -     Hemoglobin A1c; Future -     Lipid panel; Future -     T4, free; Future -     TSH; Future -     VITAMIN D  25 Hydroxy (Vit-D Deficiency, Fractures); Future  Essential hypertension -     Comprehensive metabolic panel with GFR; Future -     T4, free; Future -     TSH; Future  Routine screening for STI (sexually transmitted infection) -     HIV Antibody (routine testing w rflx); Future  Obesity with serious comorbidity, unspecified class, unspecified obesity type -     Hemoglobin A1c; Future -     Lipid panel; Future -     T4, free; Future -     TSH; Future -     VITAMIN D  25 Hydroxy (Vit-D Deficiency, Fractures); Future   Age-appropriate health screenings discussed.  Obtain labs.  Immunizations reviewed.  Pap to be scheduled with OB/Gyn.  Colonoscopy not yet indicated.  BP elevated. Recheck.   Discussed adding medication to regimen.  Pt would like to start lifestyle modifications.  Continue procardia  xl 90 mg.  Wt loss would help with HTN and overall health.    Return in about 1 month (around 09/16/2024) for blood pressure.   [name], MD     [1]  Social History Tobacco Use   Smoking status: Never   Smokeless tobacco: Never  Substance Use Topics   Alcohol use: No   Drug use: No  [2] No Known Allergies  "

## 2024-08-17 LAB — LIPID PANEL
Cholesterol: 163 mg/dL (ref 0–200)
HDL: 63.6 mg/dL (ref >39.00)
LDL Cholesterol: 87 mg/dL (ref 0–99)
NonHDL: 99.46
Total CHOL/HDL Ratio: 3
Triglycerides: 63 mg/dL (ref 0.0–149.0)
VLDL: 12.6 mg/dL (ref 0.0–40.0)

## 2024-08-17 LAB — COMPREHENSIVE METABOLIC PANEL WITH GFR
ALT: 11 U/L (ref 0–35)
AST: 20 U/L (ref 0–37)
Albumin: 4 g/dL (ref 3.5–5.2)
Alkaline Phosphatase: 91 U/L (ref 39–117)
BUN: 9 mg/dL (ref 6–23)
CO2: 26 meq/L (ref 19–32)
Calcium: 9.4 mg/dL (ref 8.4–10.5)
Chloride: 101 meq/L (ref 96–112)
Creatinine, Ser: 0.92 mg/dL (ref 0.40–1.20)
GFR: 79.4 mL/min (ref >60.00)
Glucose, Bld: 66 mg/dL — ABNORMAL LOW (ref 70–99)
Potassium: 4.2 meq/L (ref 3.5–5.1)
Sodium: 138 meq/L (ref 135–145)
Total Bilirubin: 0.3 mg/dL (ref 0.2–1.2)
Total Protein: 7.6 g/dL (ref 6.0–8.3)

## 2024-08-17 LAB — CBC WITH DIFFERENTIAL/PLATELET
Basophils Absolute: 0.2 K/uL — ABNORMAL HIGH (ref 0.0–0.1)
Basophils Relative: 1.6 % (ref 0.0–3.0)
Eosinophils Absolute: 0.1 K/uL (ref 0.0–0.7)
Eosinophils Relative: 0.7 % (ref 0.0–5.0)
HCT: 31.9 % — ABNORMAL LOW (ref 36.0–46.0)
Hemoglobin: 10 g/dL — ABNORMAL LOW (ref 12.0–15.0)
Lymphocytes Relative: 21.8 % (ref 12.0–46.0)
Lymphs Abs: 2.3 K/uL (ref 0.7–4.0)
MCHC: 31.5 g/dL (ref 30.0–36.0)
MCV: 77.7 fl — ABNORMAL LOW (ref 78.0–100.0)
Monocytes Absolute: 0.7 K/uL (ref 0.1–1.0)
Monocytes Relative: 6.8 % (ref 3.0–12.0)
Neutro Abs: 7.3 K/uL (ref 1.4–7.7)
Neutrophils Relative %: 69.1 % (ref 43.0–77.0)
Platelets: 460 K/uL — ABNORMAL HIGH (ref 150.0–400.0)
RBC: 4.1 Mil/uL (ref 3.87–5.11)
RDW: 16.3 % — ABNORMAL HIGH (ref 11.5–15.5)
WBC: 10.5 K/uL (ref 4.0–10.5)

## 2024-08-17 LAB — VITAMIN D 25 HYDROXY (VIT D DEFICIENCY, FRACTURES): VITD: 13.57 ng/mL — ABNORMAL LOW (ref 30.00–100.00)

## 2024-08-17 LAB — HEMOGLOBIN A1C: Hgb A1c MFr Bld: 5.6 % (ref 4.6–6.5)

## 2024-08-17 LAB — TSH: TSH: 0.82 u[IU]/mL (ref 0.35–5.50)

## 2024-08-17 LAB — T4, FREE: Free T4: 0.84 ng/dL (ref 0.60–1.60)

## 2024-08-18 ENCOUNTER — Ambulatory Visit: Payer: Self-pay | Admitting: Family Medicine

## 2024-08-18 ENCOUNTER — Other Ambulatory Visit (HOSPITAL_BASED_OUTPATIENT_CLINIC_OR_DEPARTMENT_OTHER): Payer: Self-pay

## 2024-08-18 DIAGNOSIS — E559 Vitamin D deficiency, unspecified: Secondary | ICD-10-CM | POA: Insufficient documentation

## 2024-08-18 DIAGNOSIS — D509 Iron deficiency anemia, unspecified: Secondary | ICD-10-CM

## 2024-08-18 LAB — HIV ANTIBODY (ROUTINE TESTING W REFLEX)
HIV 1&2 Ab, 4th Generation: NONREACTIVE
HIV FINAL INTERPRETATION: NEGATIVE

## 2024-08-18 MED ORDER — VITAMIN D (ERGOCALCIFEROL) 1.25 MG (50000 UNIT) PO CAPS
50000.0000 [IU] | ORAL_CAPSULE | ORAL | 0 refills | Status: DC
  Filled 2024-08-18: qty 8, 56d supply, fill #0

## 2024-08-23 ENCOUNTER — Other Ambulatory Visit (HOSPITAL_BASED_OUTPATIENT_CLINIC_OR_DEPARTMENT_OTHER): Payer: Self-pay

## 2024-08-23 ENCOUNTER — Other Ambulatory Visit (HOSPITAL_COMMUNITY): Payer: Self-pay

## 2024-08-23 ENCOUNTER — Other Ambulatory Visit: Payer: Self-pay | Admitting: Family Medicine

## 2024-08-23 DIAGNOSIS — E559 Vitamin D deficiency, unspecified: Secondary | ICD-10-CM

## 2024-09-13 ENCOUNTER — Other Ambulatory Visit: Payer: Self-pay

## 2024-09-13 ENCOUNTER — Ambulatory Visit: Admitting: Family Medicine

## 2024-09-13 ENCOUNTER — Encounter: Payer: Self-pay | Admitting: Family Medicine

## 2024-09-13 ENCOUNTER — Other Ambulatory Visit (HOSPITAL_BASED_OUTPATIENT_CLINIC_OR_DEPARTMENT_OTHER): Payer: Self-pay

## 2024-09-13 VITALS — BP 148/92 | HR 87 | Temp 98.7°F | Ht 65.0 in

## 2024-09-13 DIAGNOSIS — D509 Iron deficiency anemia, unspecified: Secondary | ICD-10-CM | POA: Diagnosis not present

## 2024-09-13 DIAGNOSIS — I10 Essential (primary) hypertension: Secondary | ICD-10-CM | POA: Diagnosis not present

## 2024-09-13 DIAGNOSIS — E559 Vitamin D deficiency, unspecified: Secondary | ICD-10-CM | POA: Diagnosis not present

## 2024-09-13 DIAGNOSIS — D75838 Other thrombocytosis: Secondary | ICD-10-CM

## 2024-09-13 MED ORDER — NIFEDIPINE ER OSMOTIC RELEASE 30 MG PO TB24
30.0000 mg | ORAL_TABLET | Freq: Every day | ORAL | 3 refills | Status: AC
  Filled 2024-09-13: qty 90, 90d supply, fill #0

## 2024-09-13 MED ORDER — VITAMIN D (ERGOCALCIFEROL) 1.25 MG (50000 UNIT) PO CAPS
50000.0000 [IU] | ORAL_CAPSULE | ORAL | 0 refills | Status: AC
  Filled 2024-09-13 – 2024-09-15 (×2): qty 12, 84d supply, fill #0

## 2024-09-13 NOTE — Progress Notes (Signed)
 "  Established Patient Office Visit   Subjective  Patient ID: Cristina Murphy, female    DOB: 05/20/1986  Age: 39 y.o. MRN: [id number]  Chief Complaint  Patient presents with   Medical Management of Chronic Issues    Patient came in today for a blood pressure follow-up, patient is not taking bp at home     Pt is a 39 yo female seen for f/u on HTN.  Not checking bp at home.  States taking Procardia  XL 90 mg at bedtime.  Would like to be off bp meds.  Pt states she just needs to make changes, knows what to do.  Denies any barriers to making changes.  In the past lost 100 lbs with diet and exercise alone.  Pt has questions about age for screening mammograms and on recent lab results.  Has a h/o inverted R nipple, no changes otherwise noted.     Patient Active Problem List   Diagnosis Date Noted   Vitamin D  deficiency 08/18/2024   Abnormal Pap smear of cervix 08/11/2023   Morbid obesity (HCC) 08/11/2023   Overweight 08/11/2023   Urinary tract infectious disease 08/11/2023   Contraceptive use 11/01/2014   Pre-eclampsia 05/26/2012   Past Medical History:  Diagnosis Date   Hypertension    Preeclampsia 2013   Past Surgical History:  Procedure Laterality Date   NO PAST SURGERIES     Social History[1] Family History  Problem Relation Age of Onset   Diabetes Father    Hypertension Father    Hypertension Mother    Healthy Sister    Hypertension Maternal Grandmother    Hypertension Paternal Grandmother    Glaucoma Paternal Grandmother    Allergies[2]  ROS Negative unless stated above    Objective:     BP (!) 148/92 (BP Location: Left Arm, Patient Position: Sitting, Cuff Size: Large)   Pulse 87   Temp 98.7 F (37.1 C) (Oral)   Ht 5' 5 (1.651 m)   LMP 08/15/2024 (Exact Date)   SpO2 99%   BMI 34.93 kg/m  BP Readings from Last 3 Encounters:  09/13/24 (!) 148/92  08/16/24 (!) 138/100  06/07/24 (!) 132/90   Wt Readings from Last 3 Encounters:  06/26/18 209 lb 14.4 oz  (95.2 kg)  06/24/17 182 lb 8 oz (82.8 kg)  06/18/16 196 lb 6.4 oz (89.1 kg)      Physical Exam Constitutional:      Appearance: Normal appearance.  HENT:     Head: Normocephalic and atraumatic.     Mouth/Throat:     Mouth: Mucous membranes are moist.  Cardiovascular:     Rate and Rhythm: Normal rate.  Pulmonary:     Effort: Pulmonary effort is normal.  Skin:    General: Skin is warm and dry.  Neurological:     Mental Status: She is alert and oriented to person, place, and time.        08/16/2024    3:09 PM 06/07/2024    9:02 AM 08/11/2023    3:43 PM  Depression screen PHQ 2/9  Decreased Interest 0 0 0  Down, Depressed, Hopeless 0 0 0  PHQ - 2 Score 0 0 0  Altered sleeping 0 0 0  Tired, decreased energy 0 0 0  Change in appetite 0 0 0  Feeling bad or failure about yourself  0 0 0  Trouble concentrating 0 0 0  Moving slowly or fidgety/restless 0 0 0  Suicidal thoughts 0 0 0  PHQ-9 Score 0 0  0      Data saved with a previous flowsheet row definition      08/16/2024    3:09 PM 06/07/2024    9:02 AM 08/11/2023    3:43 PM  GAD 7 : Generalized Anxiety Score  Nervous, Anxious, on Edge 0 0 0  Control/stop worrying 0 0 0  Worry too much - different things 0 0 0  Trouble relaxing 0 0 0  Restless 0 0 0  Easily annoyed or irritable 0 0 0  Afraid - awful might happen 0 0 0  Total GAD 7 Score 0 0 0     No results found for any visits on 09/13/24.    Assessment & Plan:   Essential hypertension -     NIFEdipine  ER Osmotic Release; Take 1 tablet (30 mg total) by mouth daily. Take with a 90 mg tab for a total daily dose of 120 mg.  Dispense: 90 tablet; Refill: 3  Vitamin D  deficiency -     Vitamin D  (Ergocalciferol ); Take 1 capsule (50,000 Units total) by mouth every 7 (seven) days.  Dispense: 12 capsule; Refill: 0  Iron deficiency anemia, unspecified iron deficiency anemia type  Reactive thrombocytosis  Continued elevation in BP.  Recheck 146/92.  Increase  Procardia  ER to 120 mg daily.  Will need to take a 90 mg tab with a 30 mg tab for the total daily dose.  Discussed the importance of checking BP at home and keeping a log to bring to next appointment.  Lifestyle modification strongly encouraged.  Rx for vitamin D  supplement resent to pharmacy.  Discussed starting OTC iron supplement such as ferrous gluconate 325 mg daily as hemoglobin 10.  On 08/16/2024.  Was on menses during time of labs.  Patient asymptomatic.  Advised thrombocytosis reactive due to anemia.  MiraLAX if needed for constipation.  Recheck CBC at next [facility].  Return in about 6 weeks (around 10/25/2024) for blood pressure, anemia.   [name], MD     [1]  Social History Tobacco Use   Smoking status: Never   Smokeless tobacco: Never  Substance Use Topics   Alcohol use: No   Drug use: No  [2] No Known Allergies  "

## 2024-09-15 ENCOUNTER — Other Ambulatory Visit (HOSPITAL_BASED_OUTPATIENT_CLINIC_OR_DEPARTMENT_OTHER): Payer: Self-pay

## 2024-11-08 ENCOUNTER — Ambulatory Visit: Admitting: Family Medicine

## 2024-11-09 ENCOUNTER — Ambulatory Visit: Admitting: Family Medicine

## 2025-08-22 ENCOUNTER — Encounter: Admitting: Family Medicine
